# Patient Record
Sex: Female | Born: 1985
Health system: Southern US, Community
[De-identification: ages and names within clinical notes are randomized; demographics above are authoritative.]

## PROBLEM LIST (undated history)

## (undated) DIAGNOSIS — Z789 Other specified health status: Secondary | ICD-10-CM

## (undated) HISTORY — DX: Other specified health status: Z78.9

---

## 2009-11-26 ENCOUNTER — Emergency Department: Payer: Self-pay | Admitting: Unknown Physician Specialty

## 2010-03-26 ENCOUNTER — Ambulatory Visit: Payer: Self-pay | Admitting: Family Medicine

## 2012-10-02 ENCOUNTER — Ambulatory Visit: Payer: Self-pay | Admitting: Obstetrics & Gynecology

## 2013-12-31 ENCOUNTER — Ambulatory Visit: Payer: Self-pay | Admitting: Obstetrics & Gynecology

## 2014-05-16 IMAGING — US US BREAST BILAT
1 series · 14 of 25 positions shown · non-contrast
Comparison: none

REASON FOR EXAM: BIL BRST MASS UOQ
COMMENTS:

PROCEDURE:     US  - US BREAST BILATERAL  - October 02, 2012  [DATE]
RESULT:     Targeted bilateral breast ultrasound is performed in the upper
outer quadrants of both breasts. The ultrasound is negative for solid or
cystic masses. Dense parenchyma with a heterogeneous echotexture is present.

[Series 1: us breast bilat · 0.08mm/px · 14 of 52 slices shown]
[im 1/52]
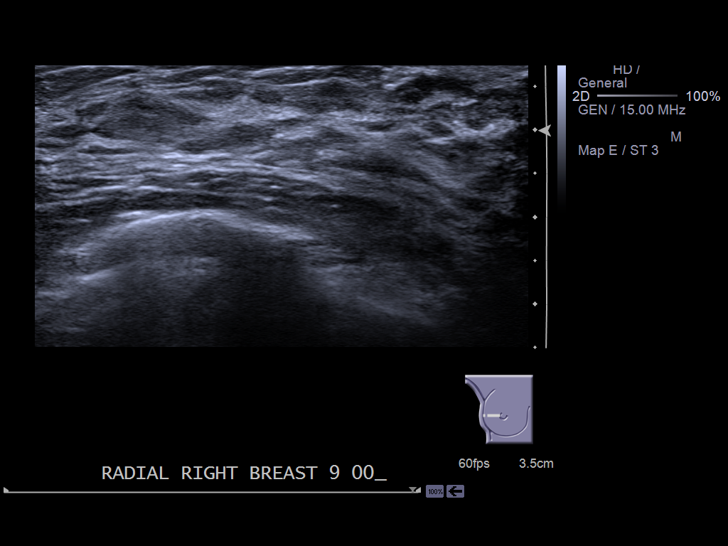
[im 5/52]
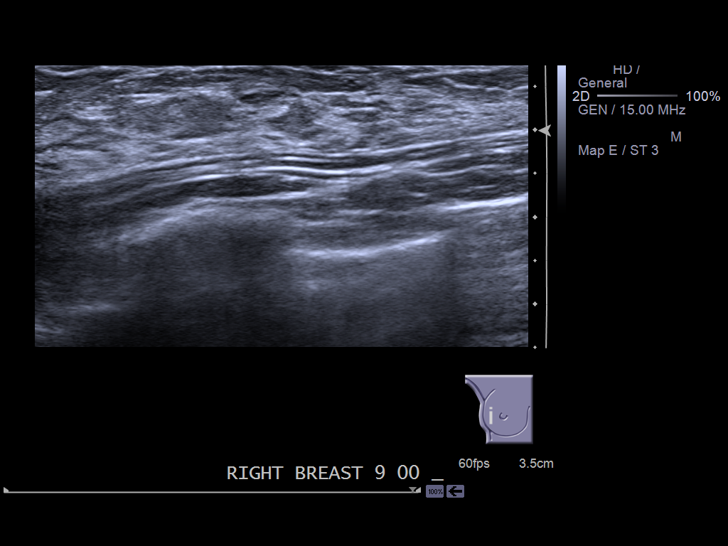
[im 9/52]
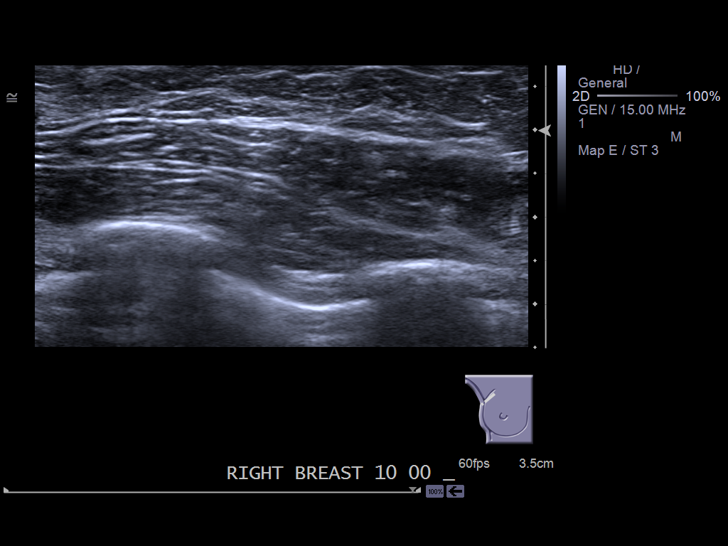
[im 13/52]
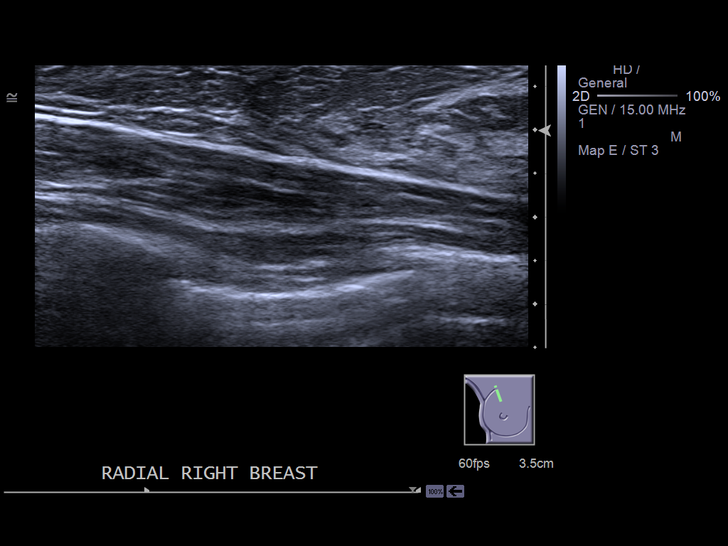
[im 18/52]
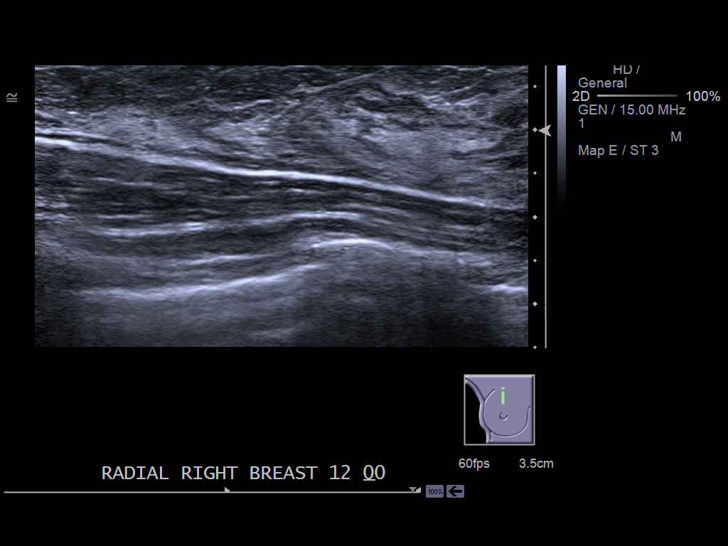
[im 20/52]
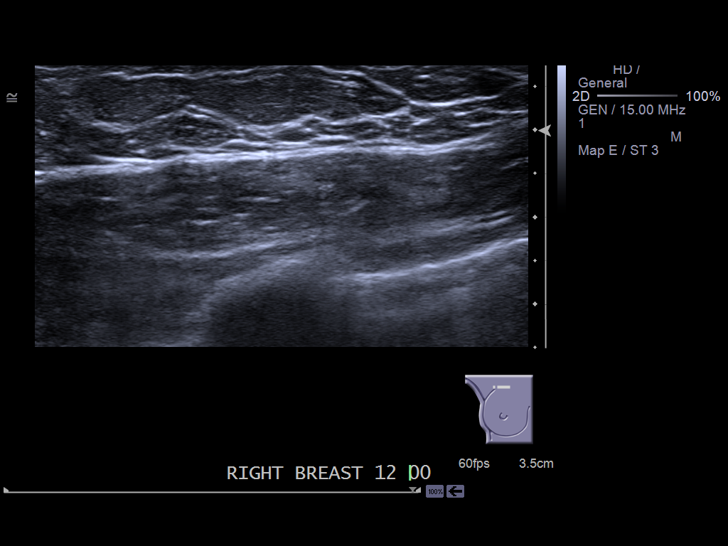
[im 24/52]
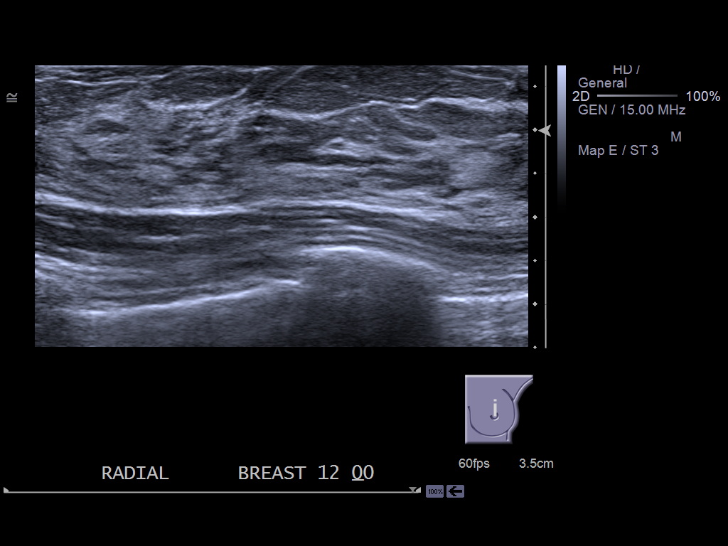
[im 28/52]
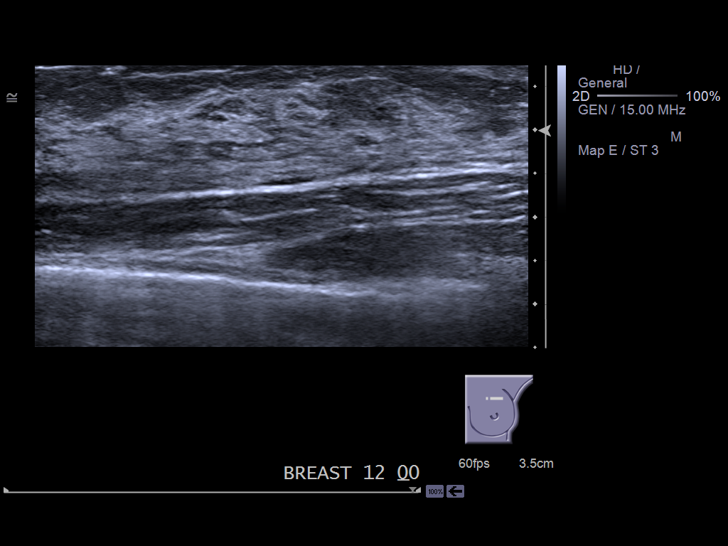
[im 32/52]
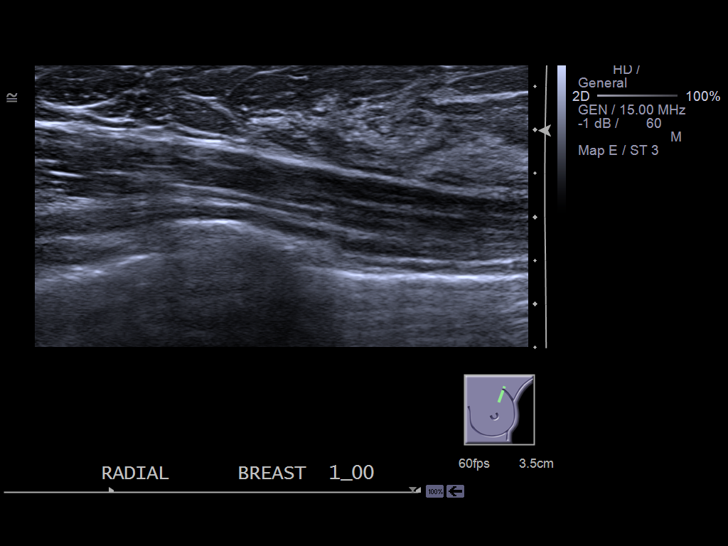
[im 35/52]
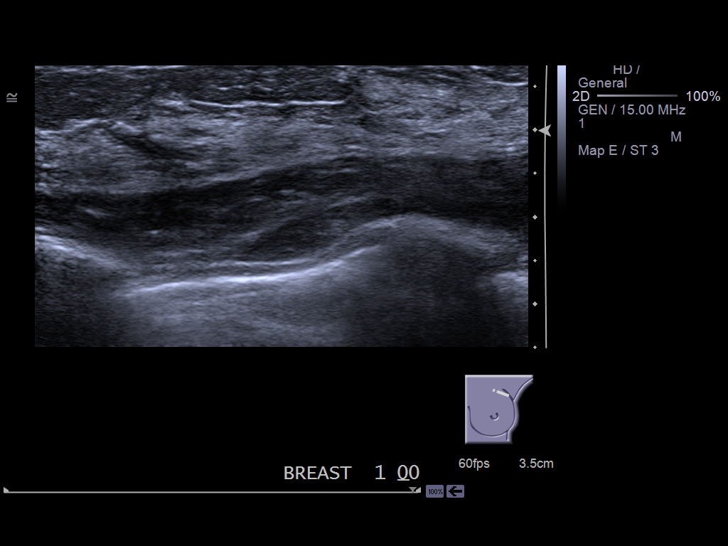
[im 39/52]
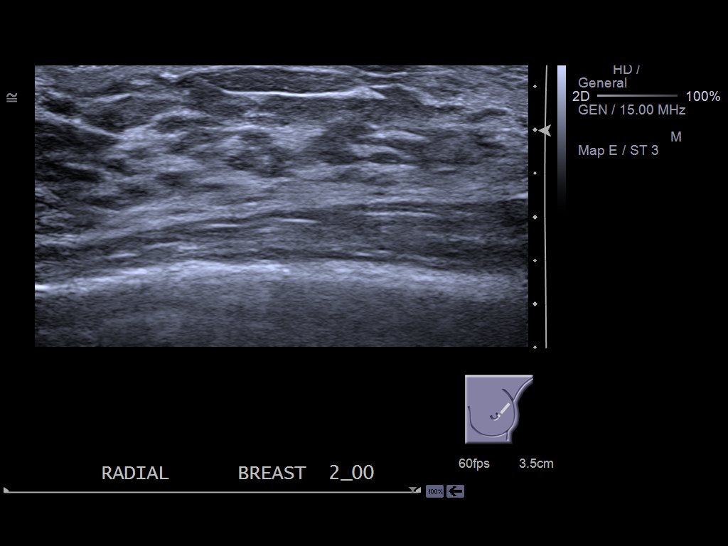
[im 43/52]
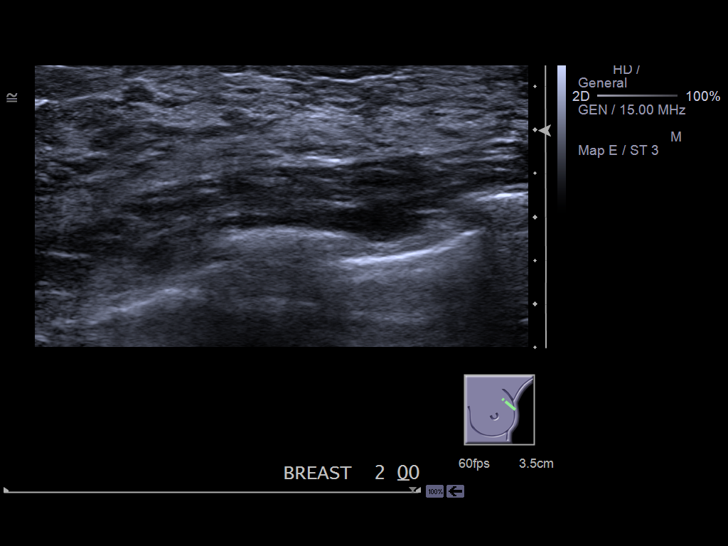
[im 47/52]
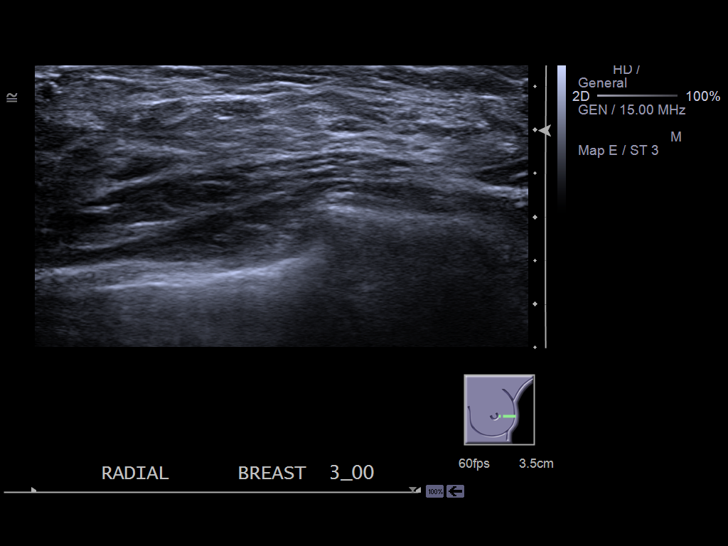
[im 52/52]
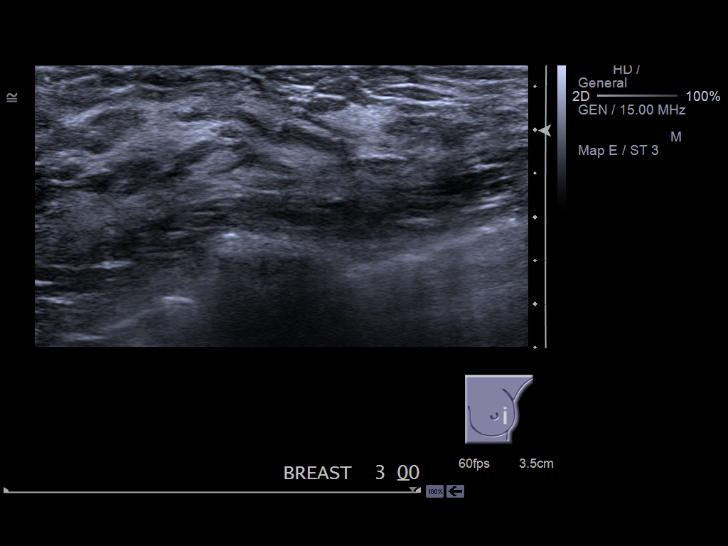

[14 of 25 positions shown; findings below may reference images not displayed]

IMPRESSION: 1. Negative targeted upper outer quadrant bilateral breast ultrasound.

[REDACTED]

## 2015-08-14 IMAGING — US US BREAST*R* LIMITED INC AXILLA
1 series · 2 of 2 positions shown · non-contrast
Comparison: Bilateral breast ultrasound dated 10/02/2012.

CLINICAL DATA: Mass felt by the patient in the upper outer right
breast. She reports that this is more focal and discrete than what
she felt last year.

EXAM:
ULTRASOUND OF THE RIGHT BREAST

[Series 1: us breast*right* limited inc axilla · 0.08mm/px · 2 of 2 slices shown]
[im 1/2]
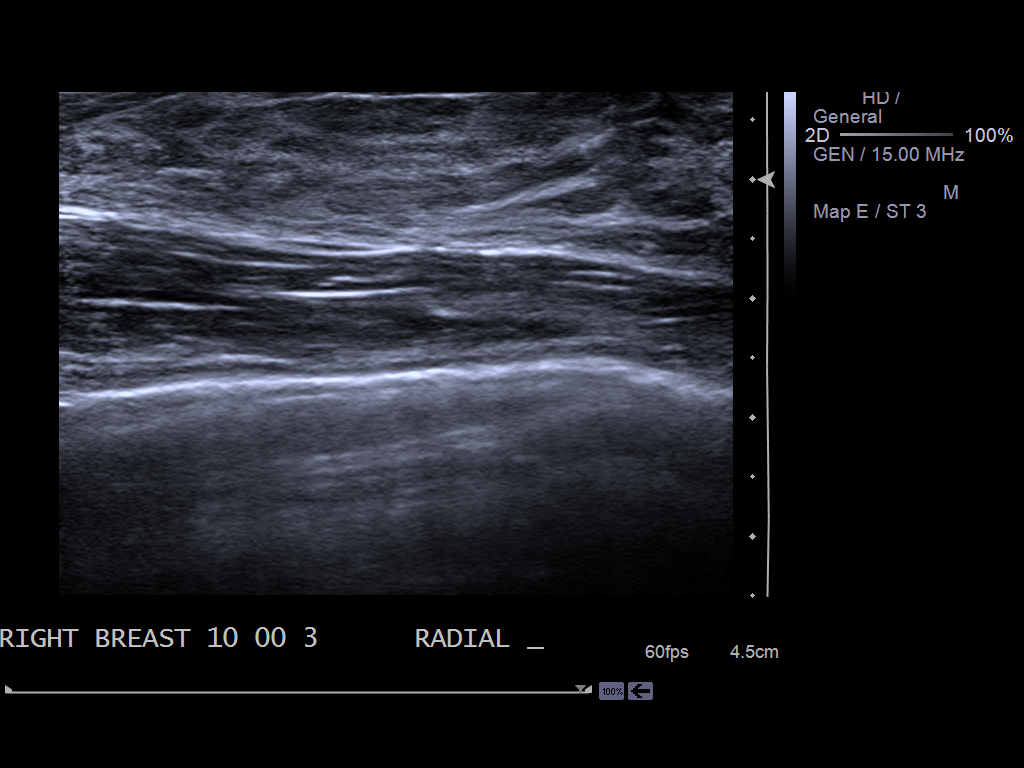
[im 2/2]
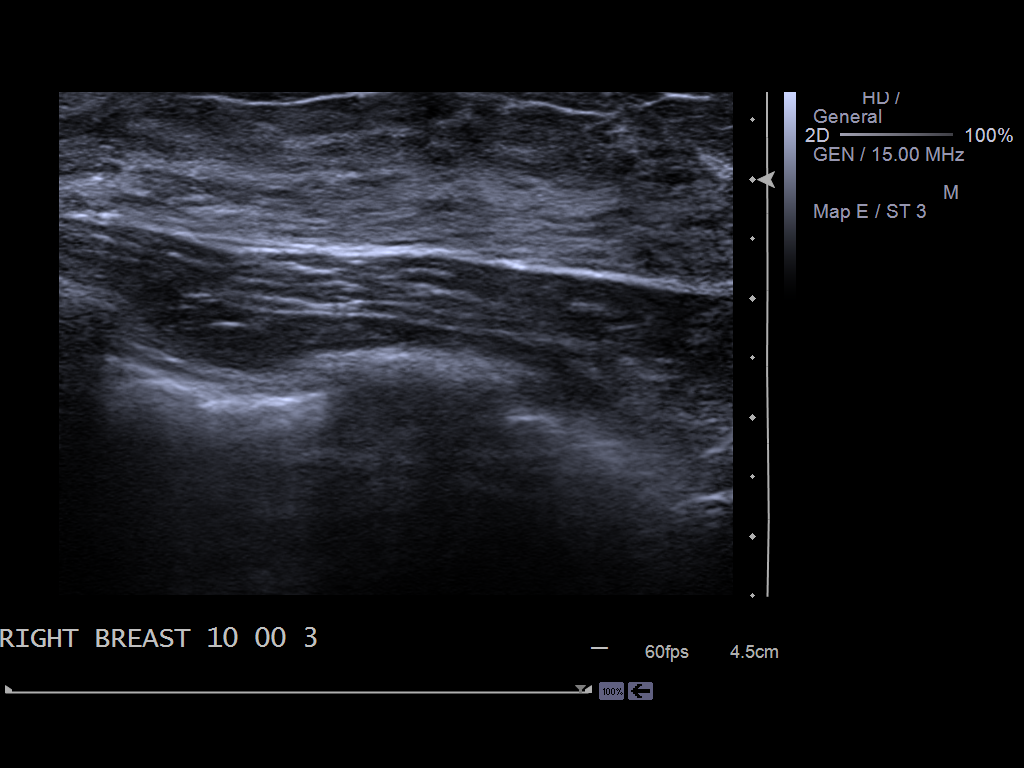

[2 of 2 positions shown; findings below may reference images not displayed]

FINDINGS: On physical exam,there is diffuse palpable soft tissue thickening in
the upper outer right breast with no discrete palpable mass.

Ultrasound is performed, showing normal appearing dense glandular
tissue in the upper outer right breast in the area of patient
concern. No discrete mass is seen.
IMPRESSION: No evidence of malignancy.

RECOMMENDATION:
Annual screening mammography beginning at age 40.

I have discussed the findings and recommendations with the patient.
Results were also provided in writing at the conclusion of the
visit. If applicable, a reminder letter will be sent to the patient
regarding the next appointment.

BI-RADS CATEGORY  1: Negative.

## 2016-06-10 ENCOUNTER — Ambulatory Visit (INDEPENDENT_AMBULATORY_CARE_PROVIDER_SITE_OTHER): Payer: 59 | Admitting: Unknown Physician Specialty

## 2016-06-10 ENCOUNTER — Encounter: Payer: Self-pay | Admitting: Unknown Physician Specialty

## 2016-06-10 VITALS — BP 112/74 | HR 68 | Temp 98.5°F | Ht 66.0 in | Wt 159.8 lb

## 2016-06-10 DIAGNOSIS — Z0000 Encounter for general adult medical examination without abnormal findings: Secondary | ICD-10-CM | POA: Diagnosis not present

## 2016-06-10 NOTE — Progress Notes (Signed)
BP 112/74 (BP Location: Left Arm, Patient Position: Sitting, Cuff Size: Normal)   Pulse 68   Temp 98.5 F (36.9 C)   Ht 5\' 6"  (1.676 m)   Wt 159 lb 12.8 oz (72.5 kg)   LMP 04/19/2016 (Exact Date)   SpO2 97%   BMI 25.79 kg/m    Subjective:    Patient ID: Laura Carey, female    DOB: 05/15/1986, 30 y.o.   MRN: [id number]  HPI: Laura Carey is a 30 y.o. female  Chief Complaint  Patient presents with  . Establish Care   Pt is here to be established as a patient.  She is anxious about her pregnancy and has had a miscarriage in the past.  She is being treated for a UTI.    She would like some blood tests as they are not done at [facility] and it has been a long time since she had one.    Depression screen PHQ 2/9 06/10/2016  Decreased Interest 1  Down, Depressed, Hopeless 0  PHQ - 2 Score 1    Relevant past medical, surgical, family and social history reviewed and updated as indicated. Interim medical history since our last visit reviewed. Allergies and medications reviewed and updated.  Review of Systems  Constitutional: Negative.   HENT: Negative.   Eyes: Negative.   Respiratory: Negative.   Cardiovascular: Negative.   Gastrointestinal: Negative.   Endocrine: Negative.   Genitourinary: Negative.   Musculoskeletal: Negative.   Skin: Negative.   Allergic/Immunologic: Negative.   Neurological: Negative.   Hematological: Negative.   Psychiatric/Behavioral: Negative.     Per HPI unless specifically indicated above     Objective:    BP 112/74 (BP Location: Left Arm, Patient Position: Sitting, Cuff Size: Normal)   Pulse 68   Temp 98.5 F (36.9 C)   Ht 5\' 6"  (1.676 m)   Wt 159 lb 12.8 oz (72.5 kg)   LMP 04/19/2016 (Exact Date)   SpO2 97%   BMI 25.79 kg/m   Wt Readings from Last 3 Encounters:  06/10/16 159 lb 12.8 oz (72.5 kg)    Physical Exam  Constitutional: She is oriented to person, place, and time. She appears well-developed and well-nourished. No  distress.  HENT:  Head: Normocephalic and atraumatic.  Eyes: Conjunctivae and lids are normal. Right eye exhibits no discharge. Left eye exhibits no discharge. No scleral icterus.  Neck: Normal range of motion. Neck supple. No JVD present. Carotid bruit is not present.  Cardiovascular: Normal rate, regular rhythm and normal heart sounds.   Pulmonary/Chest: Effort normal and breath sounds normal.  Abdominal: Normal appearance. There is no splenomegaly or hepatomegaly.  Musculoskeletal: Normal range of motion.  Neurological: She is alert and oriented to person, place, and time.  Skin: Skin is warm, dry and intact. No rash noted. No pallor.  Psychiatric: She has a normal mood and affect. Her behavior is normal. Judgment and thought content normal.    No results found for this or any previous visit.    Assessment & Plan:   Problem List Items Addressed This Visit    None    Visit Diagnoses    Health care maintenance    -  Primary   Relevant Orders   HIV antibody   Routine general medical examination at a health care facility       Relevant Orders   Comprehensive metabolic panel   CBC with Differential/Platelet   Lipid Panel w/o Chol/HDL Ratio   TSH  Follow up plan: Return in about 1 year (around 06/10/2017).

## 2016-06-11 ENCOUNTER — Emergency Department: Payer: 59

## 2016-06-11 ENCOUNTER — Emergency Department
Admission: EM | Admit: 2016-06-11 | Discharge: 2016-06-11 | Disposition: A | Discharge status: Home / Self Care | Payer: 59 | Attending: Emergency Medicine | Admitting: Emergency Medicine

## 2016-06-11 ENCOUNTER — Encounter: Payer: Self-pay | Admitting: Emergency Medicine

## 2016-06-11 DIAGNOSIS — O200 Threatened abortion: Secondary | ICD-10-CM | POA: Insufficient documentation

## 2016-06-11 DIAGNOSIS — Z3A01 Less than 8 weeks gestation of pregnancy: Secondary | ICD-10-CM | POA: Diagnosis not present

## 2016-06-11 DIAGNOSIS — O209 Hemorrhage in early pregnancy, unspecified: Secondary | ICD-10-CM | POA: Diagnosis present

## 2016-06-11 LAB — URINALYSIS COMPLETE WITH MICROSCOPIC (ARMC ONLY)
Bacteria, UA: NONE SEEN
Bilirubin Urine: NEGATIVE
Glucose, UA: NEGATIVE mg/dL
KETONES UR: NEGATIVE mg/dL
Leukocytes, UA: NEGATIVE
Nitrite: NEGATIVE
PH: 7 (ref 5.0–8.0)
PROTEIN: 30 mg/dL — AB
Specific Gravity, Urine: 1.019 (ref 1.005–1.030)

## 2016-06-11 LAB — COMPREHENSIVE METABOLIC PANEL
ALK PHOS: 50 IU/L (ref 39–117)
ALT: 10 IU/L (ref 0–32)
AST: 16 IU/L (ref 0–40)
Albumin/Globulin Ratio: 1.8 (ref 1.2–2.2)
Albumin: 4.3 g/dL (ref 3.5–5.5)
BUN/Creatinine Ratio: 14 (ref 9–23)
BUN: 10 mg/dL (ref 6–20)
Bilirubin Total: 1.5 mg/dL — ABNORMAL HIGH (ref 0.0–1.2)
CALCIUM: 9.3 mg/dL (ref 8.7–10.2)
CO2: 23 mmol/L (ref 18–29)
CREATININE: 0.7 mg/dL (ref 0.57–1.00)
Chloride: 102 mmol/L (ref 96–106)
GFR calc Af Amer: 134 mL/min/{1.73_m2} (ref >59)
GFR calc non Af Amer: 117 mL/min/{1.73_m2} (ref >59)
GLOBULIN, TOTAL: 2.4 g/dL (ref 1.5–4.5)
GLUCOSE: 77 mg/dL (ref 65–99)
Potassium: 4.3 mmol/L (ref 3.5–5.2)
SODIUM: 139 mmol/L (ref 134–144)
Total Protein: 6.7 g/dL (ref 6.0–8.5)

## 2016-06-11 LAB — CBC WITH DIFFERENTIAL/PLATELET
Basophils Absolute: 0.1 10*3/uL (ref 0.0–0.2)
Basophils Absolute: 0.1 10*3/uL (ref 0–0.1)
Basophils Relative: 1 %
Basos: 0 %
EOS (ABSOLUTE): 0.2 10*3/uL (ref 0.0–0.4)
EOS: 2 %
Eosinophils Absolute: 0 10*3/uL (ref 0–0.7)
Eosinophils Relative: 1 %
HEMATOCRIT: 39.9 % (ref 34.0–46.6)
HEMATOCRIT: 41.4 % (ref 35.0–47.0)
Hemoglobin: 13.2 g/dL (ref 11.1–15.9)
Hemoglobin: 13.8 g/dL (ref 12.0–16.0)
IMMATURE GRANS (ABS): 0 10*3/uL (ref 0.0–0.1)
IMMATURE GRANULOCYTES: 0 %
LYMPHS PCT: 14 %
LYMPHS: 20 %
Lymphocytes Absolute: 2.3 10*3/uL (ref 0.7–3.1)
Lymphs Abs: 1.4 10*3/uL (ref 1.0–3.6)
MCH: 29.1 pg (ref 26.6–33.0)
MCH: 30 pg (ref 26.0–34.0)
MCHC: 33.1 g/dL (ref 31.5–35.7)
MCHC: 33.3 g/dL (ref 32.0–36.0)
MCV: 88 fL (ref 79–97)
MCV: 90.2 fL (ref 80.0–100.0)
MONO ABS: 0.7 10*3/uL (ref 0.2–0.9)
MONOS ABS: 1.1 10*3/uL — AB (ref 0.1–0.9)
MONOS PCT: 7 %
Monocytes: 9 %
NEUTROS ABS: 7.8 10*3/uL — AB (ref 1.4–6.5)
NEUTROS PCT: 69 %
Neutrophils Absolute: 7.8 10*3/uL — ABNORMAL HIGH (ref 1.4–7.0)
Neutrophils Relative %: 77 %
PLATELETS: 223 10*3/uL (ref 150–379)
Platelets: 174 10*3/uL (ref 150–440)
RBC: 4.53 x10E6/uL (ref 3.77–5.28)
RBC: 4.59 MIL/uL (ref 3.80–5.20)
RDW: 13.7 % (ref 12.3–15.4)
RDW: 13.1 % (ref 11.5–14.5)
WBC: 11.4 10*3/uL — AB (ref 3.4–10.8)
WBC: 10 10*3/uL (ref 3.6–11.0)

## 2016-06-11 LAB — LIPID PANEL W/O CHOL/HDL RATIO
Cholesterol, Total: 115 mg/dL (ref 100–199)
HDL: 41 mg/dL (ref >39)
LDL Calculated: 57 mg/dL (ref 0–99)
TRIGLYCERIDES: 83 mg/dL (ref 0–149)
VLDL Cholesterol Cal: 17 mg/dL (ref 5–40)

## 2016-06-11 LAB — PREGNANCY, URINE: Preg Test, Ur: POSITIVE — AB

## 2016-06-11 LAB — TYPE AND SCREEN
ABO/RH(D): O POS
Antibody Screen: NEGATIVE

## 2016-06-11 LAB — TSH: TSH: 0.974 u[IU]/mL (ref 0.450–4.500)

## 2016-06-11 LAB — HIV ANTIBODY (ROUTINE TESTING W REFLEX): HIV SCREEN 4TH GENERATION: NONREACTIVE

## 2016-06-11 LAB — HCG, QUANTITATIVE, PREGNANCY: hCG, Beta Chain, Quant, S: 74020 m[IU]/mL — ABNORMAL HIGH (ref <5)

## 2016-06-11 LAB — ABO/RH: ABO/RH(D): O POS

## 2016-06-11 NOTE — ED Provider Notes (Addendum)
[facility] Emergency Department Provider Note  ____________________________________________   I have reviewed the triage vital signs and the nursing notes.   HISTORY  Chief Complaint Vaginal Bleeding    HPI Laura Carey is a 30 y.o. female who is G2 P0, who had 1 spontaneous miscarriage in her life, who is not on any follicular stimulating medications presents today complaining of vaginal bleeding since last night. Patient is known to be pregnant. Her last menstrual period was September 5. This would make her approximately 7-4/7 pregnant today. She started having vaginal bleeding last night with minimal cramping. She denies any other symptoms. She states that this is somewhat less than her normal menstrual bleed. Has had 2 pads today. She states she follows with [facility]. She states she did have an ultrasound which showed an early IUP    History reviewed. No pertinent past medical history.  There are no active problems to display for this patient.   History reviewed. No pertinent surgical history.  Prior to Admission medications   Medication Sig Start Date End Date Taking? Authorizing Provider  nitrofurantoin, macrocrystal-monohydrate, (MACROBID) 100 MG capsule  05/31/16   Historical Provider, MD  Prenat-FeFum-FePo-FA-Omega 3 (CONCEPT DHA PO) Take 1 tablet by mouth daily.    Historical Provider, MD    Allergies Review of patient's allergies indicates no known allergies.  Family History  Problem Relation Age of Onset  . Cancer Maternal Grandmother     breast    Social History Social History  Substance Use Topics  . Smoking status: Never Smoker  . Smokeless tobacco: Never Used  . Alcohol use No    Review of Systems Constitutional: No fever/chills Eyes: No visual changes. ENT: No sore throat. No stiff neck no neck pain Cardiovascular: Denies chest pain. Respiratory: Denies shortness of breath. Gastrointestinal:   no vomiting.  No  diarrhea.  No constipation. Genitourinary: Negative for dysuria. Musculoskeletal: Negative lower extremity swelling Skin: Negative for rash. Neurological: Negative for severe headaches, focal weakness or numbness. 10-point ROS otherwise negative.  ____________________________________________   PHYSICAL EXAM:  VITAL SIGNS: ED Triage Vitals  Enc Vitals Group     BP 06/11/16 0708 106/65     Pulse Rate 06/11/16 0708 67     Resp 06/11/16 0708 18     Temp 06/11/16 0708 98.8 F (37.1 C)     Temp src --      SpO2 06/11/16 0708 98 %     Weight 06/11/16 0707 159 lb (72.1 kg)     Height --      Head Circumference --      Peak Flow --      Pain Score 06/11/16 0707 2     Pain Loc --      Pain Edu? --      Excl. in GC? --     Constitutional: Alert and oriented. Well appearing and in no acute distress. Eyes: Conjunctivae are normal. PERRL. EOMI. Head: Atraumatic. Nose: No congestion/rhinnorhea. Mouth/Throat: Mucous membranes are moist.  Oropharynx non-erythematous. Neck: No stridor.   Nontender with no meningismus Cardiovascular: Normal rate, regular rhythm. Grossly normal heart sounds.  Good peripheral circulation. Respiratory: Normal respiratory effort.  No retractions. Lungs CTAB. Abdominal: Soft and nontender. No distention. No guarding no rebound Back:  [place] is no focal tenderness or step off.  there is no midline tenderness there are no lesions noted. there is no CVA ten Pelvic exam: Female nurse chaperone present, no external lesions noted, physiologic vaginal discharge noted  with no purulent discharge, no cervical motion tenderness, no adnexal tenderness or mass, there is no significant uterine tenderness or mass. Mild vaginal bleeding Musculoskeletal: No lower extremity tenderness, no upper extremity tenderness. No joint effusions, no DVT signs strong distal pulses no edema Neurologic:  Normal speech and language. No gross focal neurologic deficits are appreciated.   Skin:  Skin is warm, dry and intact. No rash noted. Psychiatric: Mood and affect are normal. Speech and behavior are normal.  ____________________________________________   LABS (all labs ordered are listed, but only abnormal results are displayed)  Labs Reviewed  PREGNANCY, URINE - Abnormal; Notable for the following:       Result Value   Preg Test, Ur POSITIVE (*)    All other components within normal limits  URINALYSIS COMPLETEWITH MICROSCOPIC ([facility] ONLY) - Abnormal; Notable for the following:    Color, Urine AMBER (*)    APPearance CLEAR (*)    Hgb urine dipstick 3+ (*)    Protein, ur 30 (*)    Squamous Epithelial / LPF 0-5 (*)    All other components within normal limits  CBC WITH DIFFERENTIAL/PLATELET  CBC WITH DIFFERENTIAL/PLATELET  HCG, QUANTITATIVE, PREGNANCY  ABO/RH   ____________________________________________  EKG  I personally interpreted any EKGs ordered by me or triage  ____________________________________________  RADIOLOGY  I reviewed any imaging ordered by me or triage that were performed during my shift and, if possible, patient and/or family made aware of any abnormal findings. ____________________________________________   PROCEDURES  Procedure(s) performed: None  Procedures  Critical Care performed: None  ____________________________________________   INITIAL IMPRESSION / ASSESSMENT AND PLAN / ED COURSE  Pertinent labs & imaging results that were available during my care of the patient were reviewed by me and considered in my medical decision making (see chart for details).  Patient with what is reported to be IUP with vaginal bleeding, she is Rh+, her Sharene ButtersQuant is reported by the lab as 1363, she is hemodynamically stable. Because I cannot obtain outside ultrasound records, I'll obtain an ultrasound here which we are waiting for the results.  ----------------------------------------- 11:27 AM on  06/11/2016 -----------------------------------------  His chest with Dr. Jean RosenthalJackson who agrees with management thus far. Patient labs reported by the labs as hemoglobin of 13.7 platelets 170 white count 10.3 she remains hemodynamically stable with minimal discomfort. I am concerned about the free fluid in her pelvis. Some be a cyst rupture or possibly heterotropic ectopic but this is thought to be much less likely. Dr. Jean RosenthalJackson will evaluate the patient.  ----------------------------------------- 1:52 PM on 06/11/2016 -----------------------------------------  Seen and evaluated by Dr. Jean RosenthalJackson, he feels the patient is safe for discharge which we will do, return percussion some follow-up given and understood. Abdomen benign no evidence of hemoperitoneum clinically on exam or any tenderness.  Clinical Course   ____________________________________________   FINAL CLINICAL IMPRESSION(S) / ED DIAGNOSES  Final diagnoses:  None      This chart was dictated using voice recognition software.  Despite best efforts to proofread,  errors can occur which can change meaning.      Jeanmarie PlantJames A Laree Garron, MD 06/11/16 1042    Jeanmarie PlantJames A Melinda Gwinner, MD 06/11/16 1128    Jeanmarie PlantJames A Lashonne Shull, MD 06/11/16 [phone] [id number]

## 2016-06-11 NOTE — ED Notes (Signed)
Patient states that she is about [redacted] weeks pregnant. Patient seen by her OB at [facility] on Wednesday and had an ultrasound, per patient report everything looked good and they were able to see fetal heartbeat.  Patient states that last night around 9 pm she started having heavy bright red bleeding with clots, patient states that she has had some mild back pain.   Patient denies urinary symptoms, fevers, chills, or recent intercourse.

## 2016-06-11 NOTE — ED Notes (Signed)
Patient transported to ultrasound.

## 2016-06-11 NOTE — Consult Note (Signed)
OBSTETRICS & GYNECOLOGY CONSULT NOTE  GYN Consultation  Attending Provider: Dr.McShane  Laura Carey [id number] 06/11/2016 3:06 PM    Reason for Consultation:   Laura Carey is a 30 y.o. G2P0 female at approximately [redacted] weeks gestation with vaginal bleeding.  History of Present Ilness:   Patient had onset of vaginal bleeding last night that was described as like a period (heavy days) for her.  She continues to have some bleeding today.  She denies abdominal pain.  She is a patient with a known intrauterine pregnancy and bicornuate uterus. She has a history of a 10-week loss with G1.  She had a pelvic ultrasound 3 days ago without any issues identified per her report (no report available at this time).  Nothing makes the bleeding better or worse.  No associated pain.  Past Medical History: None  Past Surgical History: None  Allergies: No Known Allergies    Prior to Admission medications   Medication Sig Start Date End Date Taking? Authorizing Provider  nitrofurantoin, macrocrystal-monohydrate, (MACROBID) 100 MG capsule  05/31/16   Historical Provider, MD  Prenat-FeFum-FePo-FA-Omega 3 (CONCEPT DHA PO) Take 1 tablet by mouth daily.    Historical Provider, MD   Obstetric History: She is a 582P0 female s/p SAB with G1 at about 10 weeks.  Social History:  She  reports that she has never smoked. She has never used smokeless tobacco. She reports that she does not drink alcohol or use drugs.  Family History:  family history includes Cancer in her maternal grandmother.   Review of Systems:  Negative x 10 systems reviewed except as noted in the HPI.    Objective    BP 104/66   Pulse 62   Temp 98.7 F (37.1 C) (Oral)   Resp 18   Wt 159 lb (72.1 kg)   LMP 04/19/2016 (Exact Date)   SpO2 99%   BMI 25.66 kg/m  Physical Exam  General:  She is a well appearing female in no distress.  HEENT:  Normocephalic, atraumatic.   Neck:  supple, no lymphadenopathy Cardiac:  RRR Pulmonary:   Clear to auscultation bilaterally. No wheezes, rales, rhonchi.   Abdomen:  Soft, non-tender, non-distended, +BS, no rebound or guarding  Pelvic:  Deferred. Extremities:  Non-tender, symmetric no edema bilaterally.   Neurologic:  Alert & oriented x 3.  Appropriate, conversant.    Laboratory Results:   Lab Results  Component Value Date   WBC 10.0 06/11/2016   RBC 4.59 06/11/2016   HGB 13.8 06/11/2016   HCT 41.4 06/11/2016   PLT 174 06/11/2016   NA 139 06/10/2016   K 4.3 06/10/2016   CREATININE 0.70 06/10/2016   Lab Results  Component Value Date   PREGTESTUR POSITIVE (A) 06/11/2016    Imaging Results:  [place] Ob Comp Less 14 Wks  Result Date: 06/11/2016 CLINICAL DATA:  Vaginal bleeding EXAM: OBSTETRIC <14 WK US AND TRANSVAGINAL OB US TECHNIQUE: Both transabdominal and transvaginal ultrasound examinations were performed for complete evaluation of the gestation as well as the maternal uterus, adnexal regions, and pelvic cul-de-sac. Transvaginal technique was performed to assess early pregnancy. COMPARISON:  None. FINDINGS: Intrauterine gestational sac: Single Yolk sac:  Visualized. Embryo:  Visualized. Cardiac Activity: Visualized. Heart Rate: 139  bpm CRL:  11.4  mm   7 w   2 d                  US EDC: 01/26/2017 Subchorionic hemorrhage: Large heterogeneous area without internal Doppler flow, adjacent to  the gestational sac most consistent with a large subchorionic hemorrhage measuring 3.6 x 2.7 x 3.1 cm. Maternal uterus/adnexae: There is a bicornuate uterus. Right ovarian corpus luteum cyst. Normal left ovary. Moderate amount of hypoechoic pelvic free fluid. IMPRESSION: 1. Single live intrauterine pregnancy as described above. 2. Large heterogeneous area adjacent to the gestational sac most consistent with a large subchorionic hemorrhage measuring 3.6 x 2.7 x 3.1 cm. 3. Bicornuate uterus with the pregnancy in the right horn. 4. Moderate amount of complex pelvic free fluid likely hemorrhagic.  Electronically Signed   By: Elige KoHetal  Patel   On: 06/11/2016 11:06   [place] Ob Transvaginal  Result Date: 06/11/2016 CLINICAL DATA:  Vaginal bleeding EXAM: OBSTETRIC <14 WK US AND TRANSVAGINAL OB US TECHNIQUE: Both transabdominal and transvaginal ultrasound examinations were performed for complete evaluation of the gestation as well as the maternal uterus, adnexal regions, and pelvic cul-de-sac. Transvaginal technique was performed to assess early pregnancy. COMPARISON:  None. FINDINGS: Intrauterine gestational sac: Single Yolk sac:  Visualized. Embryo:  Visualized. Cardiac Activity: Visualized. Heart Rate: 139  bpm CRL:  11.4  mm   7 w   2 d                  US EDC: 01/26/2017 Subchorionic hemorrhage: Large heterogeneous area without internal Doppler flow, adjacent to the gestational sac most consistent with a large subchorionic hemorrhage measuring 3.6 x 2.7 x 3.1 cm. Maternal uterus/adnexae: There is a bicornuate uterus. Right ovarian corpus luteum cyst. Normal left ovary. Moderate amount of hypoechoic pelvic free fluid. IMPRESSION: 1. Single live intrauterine pregnancy as described above. 2. Large heterogeneous area adjacent to the gestational sac most consistent with a large subchorionic hemorrhage measuring 3.6 x 2.7 x 3.1 cm. 3. Bicornuate uterus with the pregnancy in the right horn. 4. Moderate amount of complex pelvic free fluid likely hemorrhagic. Electronically Signed   By: Elige KoHetal  Patel   On: 06/11/2016 11:06    Assessment & Recommendations   Laura Carey is a 30 y.o. G2P0 at about [redacted] weeks gestation with a threatened abortion. She also has free fluid in her pelvis that is complex.  Differential includes, ruptured hemorrhagic ovarian cyst, heterotopic pregnancy.   Recommendations:  1. Discussed very low risk of heterotopic pregnancy. Given that she currently has a viable IUP currently, there is not much to do at this time. Discussed strong possibility of SAB given large [facility] in her uterus.  2. We  did discuss the free fluid in her pelvis and the possible etiologies.  She will continue to monitor.  We discussed that if she develops severe abdominal pain and increased bleeding, that she should return to the ER for evaluation. Otherwise, discussed normal amount of bleeding with miscarriage.  She has had one before. Discussed that if she is soaking > 2 pads per hour for > 2 hours, then she should return to the ER for further evaluation. 3. Recommend follow up in the clinic with ultrasound this week to assess fetal well being and status of SAB.   Conard NovakJackson, Zarif Rathje D, MD 06/11/2016 3:06 PM

## 2016-06-11 NOTE — ED Triage Notes (Signed)
Pt to ed with c/o heavy vaginal bleeding that started last night.  Pt denies abd pain, however reports mild back pain.  Reports she is approx [redacted] weeks pregnant. G2 SAB 1

## 2016-06-13 ENCOUNTER — Encounter: Payer: Self-pay | Admitting: Unknown Physician Specialty

## 2016-09-28 ENCOUNTER — Ambulatory Visit (INDEPENDENT_AMBULATORY_CARE_PROVIDER_SITE_OTHER): Payer: 59 | Admitting: Unknown Physician Specialty

## 2016-09-28 ENCOUNTER — Encounter: Payer: Self-pay | Admitting: Unknown Physician Specialty

## 2016-09-28 VITALS — BP 119/74 | HR 54 | Temp 98.9°F | Wt 162.0 lb

## 2016-09-28 DIAGNOSIS — B09 Unspecified viral infection characterized by skin and mucous membrane lesions: Secondary | ICD-10-CM

## 2016-09-28 DIAGNOSIS — R1310 Dysphagia, unspecified: Secondary | ICD-10-CM | POA: Insufficient documentation

## 2016-09-28 DIAGNOSIS — R1319 Other dysphagia: Secondary | ICD-10-CM | POA: Insufficient documentation

## 2016-09-28 NOTE — Assessment & Plan Note (Signed)
Pt reports rash has been improving and will continue to monitor it and apply OTC topical steroid cream if itching returns.

## 2016-09-28 NOTE — Assessment & Plan Note (Addendum)
Benign exam.  Refer to ENT. 

## 2016-09-28 NOTE — Progress Notes (Signed)
BP 119/74 (BP Location: Left Arm, Patient Position: Sitting, Cuff Size: Normal)   Pulse (!) 54   Temp 98.9 F (37.2 C)   Wt 162 lb (73.5 kg)   LMP 09/01/2016 (Exact Date)   SpO2 100%   Breastfeeding? Unknown   BMI 26.15 kg/m    Subjective:    Patient ID: Laura Carey, female    DOB: 07/15/1986, 31 y.o.   MRN: [id number]  HPI: Laura Carey is a 31 y.o. female  Chief Complaint  Patient presents with  . Rash    pt states she has a rash that came up about 2 weeks ago, states it was itching at first but not now   . Throat problem    pt states that her throat is not sore but feels like something is stuck in there    Pt complaining of bilateral lower extremity rash x 2 weeks that was itching but has since been improving and no longer itches currently. Pt denies fever, use of new topical ointments/shampoos/detergents/medications, recent travel, or new exposure to the outdoors. Pt has not tried any Benadryl or topical ointments for relief and has no known drug or food allergies. Pt denies sick contacts and states husband does not have similar rash.   Pt complaining of constant difficulty swallowing x 2 weeks. She states that it "feels like something is stuck in my throat". Pt denies neck swelling, sore throat, chest pain, SOB, abdominal pain, nausea, vomiting. Pt states her diet has remained the same and she is able to tolerate solids and liquids without difficulty. Pt denies history of gastric reflux or recent illness. Pt has not taken anything OTC for relief.   Relevant past medical, surgical, family and social history reviewed and updated as indicated. Interim medical history since our last visit reviewed. Allergies and medications reviewed and updated.  Review of Systems  Per HPI unless specifically indicated above     Objective:    BP 119/74 (BP Location: Left Arm, Patient Position: Sitting, Cuff Size: Normal)   Pulse (!) 54   Temp 98.9 F (37.2 C)   Wt 162 lb (73.5 kg)    LMP 09/01/2016 (Exact Date)   SpO2 100%   Breastfeeding? Unknown   BMI 26.15 kg/m   Wt Readings from Last 3 Encounters:  09/28/16 162 lb (73.5 kg)  06/11/16 159 lb (72.1 kg)  06/10/16 159 lb 12.8 oz (72.5 kg)    Physical Exam  Constitutional: She is oriented to person, place, and time. She appears well-developed and well-nourished.  HENT:  Head: Normocephalic and atraumatic.  Right Ear: External ear normal.  Left Ear: External ear normal.  Nose: Nose normal.  Mouth/Throat: Oropharynx is clear and moist.  Eyes: Conjunctivae and EOM are normal. Pupils are equal, round, and reactive to light. Right eye exhibits no discharge. Left eye exhibits no discharge. No scleral icterus.  Neck: Normal range of motion. Neck supple. No JVD present. No tracheal deviation present. No thyromegaly present.  Cardiovascular: Normal rate, regular rhythm, normal heart sounds and intact distal pulses.  Exam reveals no gallop and no friction rub.   No murmur heard. Pulmonary/Chest: Effort normal and breath sounds normal. No stridor. No respiratory distress. She has no wheezes. She has no rales. She exhibits no tenderness.  Abdominal: Soft. Bowel sounds are normal. She exhibits no distension and no mass. There is no tenderness. There is no rebound and no guarding.  Musculoskeletal: Normal range of motion. She exhibits no edema, tenderness or deformity.  Lymphadenopathy:    She has no cervical adenopathy.  Neurological: She is alert and oriented to person, place, and time. She has normal reflexes. No cranial nerve deficit. Coordination normal.  Skin: Skin is warm and dry. Rash noted.  Bilateral calves display concentrated maculopapular erythematous rash with white central umbilical clearing. Nontender and no drainage noted. Isolated erythematous macules on dorsum of left hip.   Psychiatric: She has a normal mood and affect. Her behavior is normal. Judgment and thought content normal.  Nursing note and vitals  reviewed.     Assessment & Plan:   Problem List Items Addressed This Visit      Unprioritized   Esophageal dysphagia - Primary    Benign exam.  Refer to ENT      Relevant Orders   Ambulatory referral to ENT   Viral exanthem    Pt reports rash has been improving and will continue to monitor it and apply OTC topical steroid cream if itching returns.           Follow up plan: Return if symptoms worsen or fail to improve.

## 2016-10-03 ENCOUNTER — Encounter: Payer: Self-pay | Admitting: Unknown Physician Specialty

## 2016-10-07 ENCOUNTER — Encounter: Payer: Self-pay | Admitting: Unknown Physician Specialty

## 2016-10-10 MED ORDER — TRIAMCINOLONE ACETONIDE 0.5 % EX CREA: 1.0000 "application " | TOPICAL_CREAM | Freq: Three times a day (TID) | CUTANEOUS | 0 refills | Status: DC

## 2016-12-07 DIAGNOSIS — Q5128 Other doubling of uterus, other specified: Secondary | ICD-10-CM | POA: Insufficient documentation

## 2017-01-13 HISTORY — PX: HYSTEROSCOPY: SHX211

## 2017-03-15 ENCOUNTER — Encounter: Payer: Self-pay | Admitting: Unknown Physician Specialty

## 2017-06-12 ENCOUNTER — Encounter: Payer: 59 | Admitting: Unknown Physician Specialty

## 2017-06-21 ENCOUNTER — Ambulatory Visit (INDEPENDENT_AMBULATORY_CARE_PROVIDER_SITE_OTHER): Payer: 59 | Admitting: Unknown Physician Specialty

## 2017-06-21 ENCOUNTER — Encounter: Payer: Self-pay | Admitting: Unknown Physician Specialty

## 2017-06-21 VITALS — BP 106/74 | HR 64 | Temp 98.8°F | Ht 66.1 in | Wt 152.5 lb

## 2017-06-21 DIAGNOSIS — S43421A Sprain of right rotator cuff capsule, initial encounter: Secondary | ICD-10-CM | POA: Diagnosis not present

## 2017-06-21 DIAGNOSIS — L509 Urticaria, unspecified: Secondary | ICD-10-CM | POA: Insufficient documentation

## 2017-06-21 DIAGNOSIS — Z0000 Encounter for general adult medical examination without abnormal findings: Secondary | ICD-10-CM

## 2017-06-21 NOTE — Progress Notes (Signed)
BP 106/74   Pulse 64   Temp 98.8 F (37.1 C) (Oral)   Ht 5' 6.1" (1.679 m)   Wt 152 lb 8 oz (69.2 kg)   LMP 06/15/2017 (Exact Date)   SpO2 100%   BMI 24.54 kg/m    Subjective:    Patient ID: Laura Carey, female    DOB: 03/21/1986, 31 y.o.   MRN: [id number]  HPI: Laura Carey is a 31 y.o. female  Chief Complaint  Patient presents with  . Annual Exam   Rash Pt states he has a rash that comes and goes.  Husband as the same rash.  States the rash comes up somewhere in the body.  Describes it as dots and it itches.    Relevant past medical, surgical, family and social history reviewed and updated as indicated. Interim medical history since our last visit reviewed. Allergies and medications reviewed and updated.  Review of Systems  Constitutional: Negative.   HENT: Negative.   Eyes: Negative.   Respiratory: Negative.   Cardiovascular: Negative.   Gastrointestinal: Negative.   Endocrine: Negative.   Genitourinary: Negative.   Skin: Negative.   Allergic/Immunologic: Negative.   Neurological: Negative.   Hematological: Negative.   Psychiatric/Behavioral: Negative.     Per HPI unless specifically indicated above     Objective:    BP 106/74   Pulse 64   Temp 98.8 F (37.1 C) (Oral)   Ht 5' 6.1" (1.679 m)   Wt 152 lb 8 oz (69.2 kg)   LMP 06/15/2017 (Exact Date)   SpO2 100%   BMI 24.54 kg/m   Wt Readings from Last 3 Encounters:  06/21/17 152 lb 8 oz (69.2 kg)  09/28/16 162 lb (73.5 kg)  06/11/16 159 lb (72.1 kg)    Physical Exam  Constitutional: She is oriented to person, place, and time. She appears well-developed and well-nourished.  HENT:  Head: Normocephalic and atraumatic.  Eyes: Pupils are equal, round, and reactive to light. Right eye exhibits no discharge. Left eye exhibits no discharge. No scleral icterus.  Neck: Normal range of motion. Neck supple. Carotid bruit is not present. No thyromegaly present.  Cardiovascular: Normal rate, regular  rhythm and normal heart sounds. Exam reveals no gallop and no friction rub.  No murmur heard. Pulmonary/Chest: Effort normal and breath sounds normal. No respiratory distress. She has no wheezes. She has no rales.  Abdominal: Soft. Bowel sounds are normal. There is no tenderness. There is no rebound.  Genitourinary: No breast swelling, tenderness or discharge.  Musculoskeletal: Normal range of motion.  Lymphadenopathy:    She has no cervical adenopathy.  Neurological: She is alert and oriented to person, place, and time.  Skin: Skin is warm, dry and intact. No rash noted.  Psychiatric: She has a normal mood and affect. Her speech is normal and behavior is normal. Judgment and thought content normal. Cognition and memory are normal.    Results for orders placed or performed during the hospital encounter of 06/11/16  CBC with Differential/Platelet  Result Value Ref Range   WBC 10.0 3.6 - 11.0 K/uL   RBC 4.59 3.80 - 5.20 MIL/uL   Hemoglobin 13.8 12.0 - 16.0 g/dL   HCT 40.941.4 81.135.0 - 91.447.0 %   MCV 90.2 80.0 - 100.0 fL   MCH 30.0 26.0 - 34.0 pg   MCHC 33.3 32.0 - 36.0 g/dL   RDW 78.213.1 95.611.5 - 21.314.5 %   Platelets 174 150 - 440 K/uL   Neutrophils Relative % 77 %  Neutro Abs 7.8 (H) 1.4 - 6.5 K/uL   Lymphocytes Relative 14 %   Lymphs Abs 1.4 1.0 - 3.6 K/uL   Monocytes Relative 7 %   Monocytes Absolute 0.7 0.2 - 0.9 K/uL   Eosinophils Relative 1 %   Eosinophils Absolute 0.0 0 - 0.7 K/uL   Basophils Relative 1 %   Basophils Absolute 0.1 0 - 0.1 K/uL  Pregnancy, urine  Result Value Ref Range   Preg Test, Ur POSITIVE (A) NEGATIVE  hCG, quantitative, pregnancy  Result Value Ref Range   hCG, Beta Chain, Quant, S 74,020 (H) <5 mIU/mL  Urinalysis complete, with microscopic ([facility] only)  Result Value Ref Range   Color, Urine AMBER (A) YELLOW   APPearance CLEAR (A) CLEAR   Glucose, UA NEGATIVE NEGATIVE mg/dL   Bilirubin Urine NEGATIVE NEGATIVE   Ketones, ur NEGATIVE NEGATIVE mg/dL   Specific  Gravity, Urine 1.019 1.005 - 1.030   Hgb urine dipstick 3+ (A) NEGATIVE   pH 7.0 5.0 - 8.0   Protein, ur 30 (A) NEGATIVE mg/dL   Nitrite NEGATIVE NEGATIVE   Leukocytes, UA NEGATIVE NEGATIVE   RBC / HPF TOO NUMEROUS TO COUNT 0 - 5 RBC/hpf   WBC, UA 0-5 0 - 5 WBC/hpf   Bacteria, UA NONE SEEN NONE SEEN   Squamous Epithelial / LPF 0-5 (A) NONE SEEN   Mucus PRESENT   ABO/Rh  Result Value Ref Range   ABO/RH(D) O POS   Type and screen [facility]  Result Value Ref Range   ABO/RH(D) O POS    Antibody Screen NEG    Sample Expiration 06/14/2016   mmmmmmmmmmmmmmmmm  mm    Assessment & Plan:   Problem List Items Addressed This Visit      Unprioritized   Urticaria    Continue with Triamcinolone       Other Visit Diagnoses    Annual physical exam    -  Primary   Relevant Orders   CBC with Differential/Platelet   Comprehensive metabolic panel   Lipid Panel w/o Chol/HDL Ratio   TSH   Sprain of right rotator cuff capsule, initial encounter       Exercises given       Follow up plan: Return in about 1 year (around 06/21/2018).

## 2017-06-21 NOTE — Assessment & Plan Note (Signed)
Continue with Triamcinolone

## 2017-06-22 LAB — COMPREHENSIVE METABOLIC PANEL
A/G RATIO: 1.9 (ref 1.2–2.2)
ALBUMIN: 4.7 g/dL (ref 3.5–5.5)
ALT: 11 IU/L (ref 0–32)
AST: 13 IU/L (ref 0–40)
Alkaline Phosphatase: 53 IU/L (ref 39–117)
BILIRUBIN TOTAL: 0.8 mg/dL (ref 0.0–1.2)
BUN / CREAT RATIO: 15 (ref 9–23)
BUN: 13 mg/dL (ref 6–20)
CHLORIDE: 103 mmol/L (ref 96–106)
CO2: 28 mmol/L (ref 20–29)
Calcium: 9.5 mg/dL (ref 8.7–10.2)
Creatinine, Ser: 0.84 mg/dL (ref 0.57–1.00)
GFR calc non Af Amer: 93 mL/min/{1.73_m2} (ref >59)
GFR, EST AFRICAN AMERICAN: 107 mL/min/{1.73_m2} (ref >59)
Globulin, Total: 2.5 g/dL (ref 1.5–4.5)
Glucose: 92 mg/dL (ref 65–99)
POTASSIUM: 4.9 mmol/L (ref 3.5–5.2)
Sodium: 143 mmol/L (ref 134–144)
TOTAL PROTEIN: 7.2 g/dL (ref 6.0–8.5)

## 2017-06-22 LAB — CBC WITH DIFFERENTIAL/PLATELET
BASOS: 1 %
Basophils Absolute: 0.1 10*3/uL (ref 0.0–0.2)
EOS (ABSOLUTE): 0.2 10*3/uL (ref 0.0–0.4)
Eos: 2 %
Hematocrit: 39.3 % (ref 34.0–46.6)
Hemoglobin: 12.8 g/dL (ref 11.1–15.9)
Immature Grans (Abs): 0 10*3/uL (ref 0.0–0.1)
Immature Granulocytes: 0 %
LYMPHS ABS: 2.4 10*3/uL (ref 0.7–3.1)
LYMPHS: 34 %
MCH: 29.3 pg (ref 26.6–33.0)
MCHC: 32.6 g/dL (ref 31.5–35.7)
MCV: 90 fL (ref 79–97)
Monocytes Absolute: 0.4 10*3/uL (ref 0.1–0.9)
Monocytes: 5 %
NEUTROS ABS: 4.3 10*3/uL (ref 1.4–7.0)
Neutrophils: 58 %
PLATELETS: 261 10*3/uL (ref 150–379)
RBC: 4.37 x10E6/uL (ref 3.77–5.28)
RDW: 12.9 % (ref 12.3–15.4)
WBC: 7.3 10*3/uL (ref 3.4–10.8)

## 2017-06-22 LAB — LIPID PANEL W/O CHOL/HDL RATIO
Cholesterol, Total: 136 mg/dL (ref 100–199)
HDL: 51 mg/dL (ref >39)
LDL Calculated: 63 mg/dL (ref 0–99)
Triglycerides: 108 mg/dL (ref 0–149)
VLDL Cholesterol Cal: 22 mg/dL (ref 5–40)

## 2017-06-22 LAB — TSH: TSH: 1.41 u[IU]/mL (ref 0.450–4.500)

## 2017-06-22 NOTE — Progress Notes (Signed)
Normal labs.  Pt notified through mychart

## 2018-01-01 DIAGNOSIS — K259 Gastric ulcer, unspecified as acute or chronic, without hemorrhage or perforation: Secondary | ICD-10-CM | POA: Insufficient documentation

## 2018-04-25 NOTE — Telephone Encounter (Signed)
Pt requesting dermatology referral. Did message patient back asking for further information. She did mention a rash at last visit with Dr. Jamesetta Orleans, DNP stating, "Pt states he has a rash that comes and goes.  Husband as the same rash.  States the rash comes up somewhere in the body.  Describes it as dots and it itches."  Can this referral be placed or does she need to be seen? Please advise.

## 2018-05-03 DIAGNOSIS — R200 Anesthesia of skin: Secondary | ICD-10-CM | POA: Insufficient documentation

## 2018-06-22 ENCOUNTER — Ambulatory Visit (INDEPENDENT_AMBULATORY_CARE_PROVIDER_SITE_OTHER): Payer: 59 | Admitting: Unknown Physician Specialty

## 2018-06-22 ENCOUNTER — Encounter: Payer: Self-pay | Admitting: Unknown Physician Specialty

## 2018-06-22 VITALS — BP 115/85 | HR 66 | Temp 98.2°F | Ht 66.54 in | Wt 155.4 lb

## 2018-06-22 DIAGNOSIS — N750 Cyst of Bartholin's gland: Secondary | ICD-10-CM

## 2018-06-22 MED ORDER — AMOXICILLIN 875 MG PO TABS: 875.0000 mg | ORAL_TABLET | Freq: Two times a day (BID) | ORAL | 0 refills | Status: DC

## 2018-06-22 NOTE — Progress Notes (Signed)
BP 115/85 (BP Location: Left Arm, Patient Position: Sitting, Cuff Size: Normal)   Pulse 66   Temp 98.2 F (36.8 C)   Ht 5' 6.54" (1.69 m)   Wt 155 lb 6 oz (70.5 kg)   SpO2 100%   BMI 24.68 kg/m    Subjective:    Patient ID: Laura Carey, female    DOB: [birth year], 32 y.o.   MRN: [id number]  HPI: Laura Carey is a 32 y.o. female  Chief Complaint  Patient presents with  . Annual Exam   Pt is here for general history and physical.  Pt states she just had her 3 months post-partum check-up.  States she is still having some problems with her grade 2 tear and wants it checked.  States it is painful when she sits down on the left side.  No discharge.    Relevant past medical, surgical, family and social history reviewed and updated as indicated. Interim medical history since our last visit reviewed. Allergies and medications reviewed and updated.  Review of Systems  Constitutional: Negative.   HENT: Negative.   Eyes: Negative.   Respiratory: Negative.   Cardiovascular: Negative.   Gastrointestinal: Negative.   Endocrine: Negative.   Genitourinary: Positive for genital sores.  Musculoskeletal: Negative.   Skin: Negative.   Allergic/Immunologic: Negative.   Neurological: Negative.   Hematological: Negative.   Psychiatric/Behavioral: Negative.     Per HPI unless specifically indicated above     Objective:    BP 115/85 (BP Location: Left Arm, Patient Position: Sitting, Cuff Size: Normal)   Pulse 66   Temp 98.2 F (36.8 C)   Ht 5' 6.54" (1.69 m)   Wt 155 lb 6 oz (70.5 kg)   SpO2 100%   BMI 24.68 kg/m   Wt Readings from Last 3 Encounters:  06/22/18 155 lb 6 oz (70.5 kg)  06/21/17 152 lb 8 oz (69.2 kg)  09/28/16 162 lb (73.5 kg)    Physical Exam  Constitutional: She is oriented to person, place, and time. She appears well-developed and well-nourished. She appears distressed.  HENT:  Head: Normocephalic and atraumatic.  Eyes: Conjunctivae and lids are normal.  Right eye exhibits no discharge. Left eye exhibits no discharge. No scleral icterus.  Neck: Normal range of motion. Neck supple. No JVD present. Carotid bruit is not present.  Cardiovascular: Normal rate, regular rhythm and normal heart sounds.  Pulmonary/Chest: Effort normal and breath sounds normal.  Abdominal: Normal appearance. There is no splenomegaly or hepatomegaly.  Genitourinary:  Genitourinary Comments: Left enlarged Bartholin cyst.  Area cleaned with Betadine, numbed with lidocaine and incised with a #11 blade.  Drained a large amount of purulent and serosanguinous dressing.    Musculoskeletal: Normal range of motion.  Neurological: She is alert and oriented to person, place, and time.  Skin: Skin is warm, dry and intact. No rash noted. No pallor.  Psychiatric: She has a normal mood and affect. Her behavior is normal. Judgment and thought content normal.    Results for orders placed or performed in visit on 06/21/17  CBC with Differential/Platelet  Result Value Ref Range   WBC 7.3 3.4 - 10.8 x10E3/uL   RBC 4.37 3.77 - 5.28 x10E6/uL   Hemoglobin 12.8 11.1 - 15.9 g/dL   Hematocrit 40.9 81.1 - 46.6 %   MCV 90 79 - 97 fL   MCH 29.3 26.6 - 33.0 pg   MCHC 32.6 31.5 - 35.7 g/dL   RDW 91.4 78.2 - 95.6 %  Platelets 261 150 - 379 x10E3/uL   Neutrophils 58 Not Estab. %   Lymphs 34 Not Estab. %   Monocytes 5 Not Estab. %   Eos 2 Not Estab. %   Basos 1 Not Estab. %   Neutrophils Absolute 4.3 1.4 - 7.0 x10E3/uL   Lymphocytes Absolute 2.4 0.7 - 3.1 x10E3/uL   Monocytes Absolute 0.4 0.1 - 0.9 x10E3/uL   EOS (ABSOLUTE) 0.2 0.0 - 0.4 x10E3/uL   Basophils Absolute 0.1 0.0 - 0.2 x10E3/uL   Immature Granulocytes 0 Not Estab. %   Immature Grans (Abs) 0.0 0.0 - 0.1 x10E3/uL  Comprehensive metabolic panel  Result Value Ref Range   Glucose 92 65 - 99 mg/dL   BUN 13 6 - 20 mg/dL   Creatinine, Ser 1.61 0.57 - 1.00 mg/dL   GFR calc non Af Amer 93 >59 mL/min/1.73   GFR calc Af Amer 107 >59  mL/min/1.73   BUN/Creatinine Ratio 15 9 - 23   Sodium 143 134 - 144 mmol/L   Potassium 4.9 3.5 - 5.2 mmol/L   Chloride 103 96 - 106 mmol/L   CO2 28 20 - 29 mmol/L   Calcium 9.5 8.7 - 10.2 mg/dL   Total Protein 7.2 6.0 - 8.5 g/dL   Albumin 4.7 3.5 - 5.5 g/dL   Globulin, Total 2.5 1.5 - 4.5 g/dL   Albumin/Globulin Ratio 1.9 1.2 - 2.2   Bilirubin Total 0.8 0.0 - 1.2 mg/dL   Alkaline Phosphatase 53 39 - 117 IU/L   AST 13 0 - 40 IU/L   ALT 11 0 - 32 IU/L  Lipid Panel w/o Chol/HDL Ratio  Result Value Ref Range   Cholesterol, Total 136 100 - 199 mg/dL   Triglycerides 096 0 - 149 mg/dL   HDL 51 >04 mg/dL   VLDL Cholesterol Cal 22 5 - 40 mg/dL   LDL Calculated 63 0 - 99 mg/dL  TSH  Result Value Ref Range   TSH 1.410 0.450 - 4.500 uIU/mL      Assessment & Plan:   Problem List Items Addressed This Visit    None    Visit Diagnoses    Bartholin cyst    -  Primary   Instructed to use sitz bath TID.  Call gyn first thing on Monday to schedule a follow-up.  Rx for Amoxil as she is breast feeding.         Follow up plan: Return for RTC for physical.  Unable to do today.

## 2018-06-22 NOTE — Patient Instructions (Signed)
Bartholin Cyst or Abscess A Bartholin cyst is a fluid-filled sac that forms on a Bartholin gland. Bartholin glands are small glands that are located within the folds of skin (labia) along the sides of the lower opening of the vagina. These glands produce a fluid to moisten the outside of the vagina during sexual intercourse. A Bartholin cyst causes a bulge on the side of the vagina. A cyst that is not large or infected may not cause symptoms or problems. However, if the fluid within the cyst becomes infected, the cyst can turn into an abscess. An abscess may cause discomfort or pain. What are the causes? A Bartholin cyst may develop when the duct of the gland becomes blocked. In many cases, the cause of this is not known. Various kinds of bacteria can cause the cyst to become infected and develop into an abscess. What increases the risk? You may be at an increased risk of developing a Bartholin cyst or abscess if:  You are a woman of reproductive age.  You have a history of previous Bartholin cysts or abscesses.  You have diabetes.  You have a sexually transmitted disease (STD).  What are the signs or symptoms? The severity of symptoms varies depending on the size of the cyst and whether it is infected. Symptoms may include:  A bulge or swelling near the lower opening of your vagina.  Discomfort or pain.  Redness.  Pain during sexual intercourse.  Pain when walking.  Fluid draining from the area.  How is this diagnosed? Your health care provider may make a diagnosis based on your symptoms and a physical exam. He or she will look for swelling in your vaginal area. Blood tests may be done to check for infections. A sample of fluid from the cyst or abscess may also be taken to be tested in a lab. How is this treated? Small cysts that are not infected may not require any treatment. These often go away on their own. Yourhealth care provider will recommend hot baths and the use of warm  compresses. These may also be part of the treatment for an abscess. Treatment options for a large cyst or abscess may include:  Antibiotic medicine.  A surgical procedure to drain the abscess. One of the following procedures may be done: ? Incision and drainage. An incision is made in the cyst or abscess so that the fluid drains out. A catheter may be placed inside the cyst so that it does not close and fill up with fluid again. The catheter will be removed after you have a follow-up visit with a specialist (gynecologist). ? Marsupialization. The cyst or abscess is opened and kept open by stitching the edges of the skin to the walls of the cyst or abscess. This allows it to continue to drain and not fill up with fluid again.  If you have cysts or abscesses that keep returning and have required incision and drainage multiple times, your health care provider may talk to you about surgery to remove the Bartholin gland. Follow these instructions at home:  Take medicines only as directed by your health care provider.  If you were prescribed an antibiotic medicine, finish it all even if you start to feel better.  Apply warm, wet compresses to the area or take warm, shallow baths that cover your pelvic region (sitz baths) several times a day or as directed by your health care provider.  Do not squeeze the cyst or apply heavy pressure to it.    Do not have sexual intercourse until the cyst has gone away.  If your cyst or abscess was opened, a small piece of gauze or a drain may have been placed in the area to allow drainage. Do not remove the gauze or the drain until directed by your health care provider.  Wear feminine pads-not tampons-as needed for any drainage or bleeding.  Keep all follow-up visits as directed by your health care provider. This is important. How is this prevented? Take these steps to help prevent a Bartholin cyst from returning:  Practice good hygiene.  Clean your vaginal  area with mild soap and a soft cloth when you bathe.  Practice safe sex to prevent STDs.  Contact a health care provider if:  You have increased pain, swelling, or redness in the area of the cyst.  Puslike drainage is coming from the cyst.  You have a fever. This information is not intended to replace advice given to you by your health care provider. Make sure you discuss any questions you have with your health care provider. Document Released: 08/01/2005 Document Revised: 01/07/2016 Document Reviewed: 03/17/2014 Elsevier Interactive Patient Education  2018 [employer]  

## 2018-09-24 ENCOUNTER — Encounter: Payer: Self-pay | Admitting: Nurse Practitioner

## 2018-09-24 ENCOUNTER — Ambulatory Visit: Payer: 59 | Admitting: Nurse Practitioner

## 2018-09-24 ENCOUNTER — Other Ambulatory Visit: Payer: Self-pay

## 2018-09-24 DIAGNOSIS — J069 Acute upper respiratory infection, unspecified: Secondary | ICD-10-CM

## 2018-09-24 MED ORDER — FLUTICASONE PROPIONATE 50 MCG/ACT NA SUSP: 2.0000 | Freq: Every day | NASAL | 2 refills | Status: DC

## 2018-09-24 NOTE — Assessment & Plan Note (Signed)
Acute, simple treatment at this time as is breastfeeding.  Flonase script sent in for postnasal drainage.  Recommend use of OTC Claritin or Zyrtec for nasal symptoms.   - Increased rest - Increasing Fluids - Acetaminophen / ibuprofen as needed for fever/pain.  - Salt water gargling, chloraseptic spray and throat lozenges - Saline sinus flushes or a neti pot.  - Humidifying the air. - Return for worsening or continued symptoms

## 2018-09-24 NOTE — Patient Instructions (Signed)

## 2018-09-24 NOTE — Progress Notes (Signed)
BP 104/72   Pulse 66   Temp 98.7 F (37.1 C) (Oral)   Ht 5' 6.5" (1.689 m)   Wt 152 lb (68.9 kg)   SpO2 98%   BMI 24.17 kg/m    Subjective:    Patient ID: Laura Carey, female    DOB: [birth year], 33 y.o.   MRN: [id number]  HPI: Laura Carey is a 33 y.o. female  Chief Complaint  Patient presents with  . Cough    since last friday  . Nasal Congestion  . Sore Throat    scratchy throat   UPPER RESPIRATORY TRACT INFECTION Symptoms started on Friday, sore throat and congestion.  Previous URI was over a month ago. Worst symptom: sore throat Fever: no Cough: yes Shortness of breath: no Wheezing: no Chest pain: none Chest tightness: no Chest congestion: no Nasal congestion: yes Runny nose: no Post nasal drip: yes Sneezing: no Sore throat: yes Swollen glands: no Sinus pressure: no Headache: yes Face pain: no Toothache: no Ear pain: none Ear pressure: none Eyes red/itching:no Eye drainage/crusting: no  Vomiting: no Rash: no Fatigue: yes Sick contacts: no Strep contacts: no  Context: fluctuating Recurrent sinusitis: no Relief with OTC cold/cough medications: no  Treatments attempted: Ibuprofen and Robitussin , was taking leftover Amoxicillin at home  Relevant past medical, surgical, family and social history reviewed and updated as indicated. Interim medical history since our last visit reviewed. Allergies and medications reviewed and updated.  Review of Systems  Constitutional: Positive for fatigue. Negative for activity change, appetite change, diaphoresis and fever.  HENT: Positive for congestion and sore throat. Negative for ear discharge, ear pain, postnasal drip, rhinorrhea, sinus pressure, sinus pain, sneezing and voice change.   Eyes: Negative for pain and visual disturbance.  Respiratory: Positive for cough. Negative for chest tightness and shortness of breath.   Cardiovascular: Negative for chest pain, palpitations and leg swelling.    Gastrointestinal: Negative for abdominal distention, abdominal pain, constipation, diarrhea, nausea and vomiting.  Endocrine: Negative for cold intolerance, heat intolerance, polydipsia, polyphagia and polyuria.  Musculoskeletal: Negative for myalgias.  Neurological: Negative for dizziness, syncope, weakness, light-headedness, numbness and headaches.  Psychiatric/Behavioral: Negative.     Per HPI unless specifically indicated above     Objective:    BP 104/72   Pulse 66   Temp 98.7 F (37.1 C) (Oral)   Ht 5' 6.5" (1.689 m)   Wt 152 lb (68.9 kg)   SpO2 98%   BMI 24.17 kg/m   Wt Readings from Last 3 Encounters:  09/24/18 152 lb (68.9 kg)  06/22/18 155 lb 6 oz (70.5 kg)  06/21/17 152 lb 8 oz (69.2 kg)    Physical Exam Vitals signs and nursing note reviewed.  Constitutional:      General: She is awake.     Appearance: She is well-developed. She is not ill-appearing.  HENT:     Head: Normocephalic.     Right Ear: Hearing, ear canal and external ear normal. No drainage. A middle ear effusion is present.     Left Ear: Hearing, ear canal and external ear normal. No drainage. A middle ear effusion is present.     Nose: Mucosal edema and rhinorrhea present. Rhinorrhea is clear.     Right Sinus: No maxillary sinus tenderness or frontal sinus tenderness.     Left Sinus: No maxillary sinus tenderness or frontal sinus tenderness.     Mouth/Throat:     Mouth: Mucous membranes are moist.     Pharynx:  Oropharynx is clear. Posterior oropharyngeal erythema (mild with cobblestoning) present. No pharyngeal swelling or oropharyngeal exudate.  Eyes:     General: Lids are normal.        Right eye: No discharge.        Left eye: No discharge.     Conjunctiva/sclera: Conjunctivae normal.     Pupils: Pupils are equal, round, and reactive to light.  Neck:     Musculoskeletal: Normal range of motion and neck supple.     Thyroid: No thyromegaly.     Vascular: No carotid bruit or JVD.   Cardiovascular:     Rate and Rhythm: Normal rate and regular rhythm.     Heart sounds: Normal heart sounds. No murmur. No gallop.   Pulmonary:     Effort: Pulmonary effort is normal.     Breath sounds: Normal breath sounds.     Comments: Clear throughout Abdominal:     General: Bowel sounds are normal.     Palpations: Abdomen is soft. There is no hepatomegaly or splenomegaly.  Musculoskeletal:     Right lower leg: No edema.     Left lower leg: No edema.  Lymphadenopathy:     Head:     Right side of head: No submental, submandibular, tonsillar or preauricular adenopathy.     Left side of head: No submental, submandibular, tonsillar or preauricular adenopathy.     Cervical: No cervical adenopathy.  Skin:    General: Skin is warm and dry.  Neurological:     Mental Status: She is alert and oriented to person, place, and time.  Psychiatric:        Attention and Perception: Attention normal.        Mood and Affect: Mood normal.        Behavior: Behavior normal. Behavior is cooperative.        Thought Content: Thought content normal.        Judgment: Judgment normal.     Results for orders placed or performed in visit on 06/21/17  CBC with Differential/Platelet  Result Value Ref Range   WBC 7.3 3.4 - 10.8 x10E3/uL   RBC 4.37 3.77 - 5.28 x10E6/uL   Hemoglobin 12.8 11.1 - 15.9 g/dL   Hematocrit 16.139.3 09.634.0 - 46.6 %   MCV 90 79 - 97 fL   MCH 29.3 26.6 - 33.0 pg   MCHC 32.6 31.5 - 35.7 g/dL   RDW 04.512.9 40.912.3 - 81.115.4 %   Platelets 261 150 - 379 x10E3/uL   Neutrophils 58 Not Estab. %   Lymphs 34 Not Estab. %   Monocytes 5 Not Estab. %   Eos 2 Not Estab. %   Basos 1 Not Estab. %   Neutrophils Absolute 4.3 1.4 - 7.0 x10E3/uL   Lymphocytes Absolute 2.4 0.7 - 3.1 x10E3/uL   Monocytes Absolute 0.4 0.1 - 0.9 x10E3/uL   EOS (ABSOLUTE) 0.2 0.0 - 0.4 x10E3/uL   Basophils Absolute 0.1 0.0 - 0.2 x10E3/uL   Immature Granulocytes 0 Not Estab. %   Immature Grans (Abs) 0.0 0.0 - 0.1 x10E3/uL   Comprehensive metabolic panel  Result Value Ref Range   Glucose 92 65 - 99 mg/dL   BUN 13 6 - 20 mg/dL   Creatinine, Ser 9.140.84 0.57 - 1.00 mg/dL   GFR calc non Af Amer 93 >59 mL/min/1.73   GFR calc Af Amer 107 >59 mL/min/1.73   BUN/Creatinine Ratio 15 9 - 23   Sodium 143 134 - 144 mmol/L   Potassium  4.9 3.5 - 5.2 mmol/L   Chloride 103 96 - 106 mmol/L   CO2 28 20 - 29 mmol/L   Calcium 9.5 8.7 - 10.2 mg/dL   Total Protein 7.2 6.0 - 8.5 g/dL   Albumin 4.7 3.5 - 5.5 g/dL   Globulin, Total 2.5 1.5 - 4.5 g/dL   Albumin/Globulin Ratio 1.9 1.2 - 2.2   Bilirubin Total 0.8 0.0 - 1.2 mg/dL   Alkaline Phosphatase 53 39 - 117 IU/L   AST 13 0 - 40 IU/L   ALT 11 0 - 32 IU/L  Lipid Panel w/o Chol/HDL Ratio  Result Value Ref Range   Cholesterol, Total 136 100 - 199 mg/dL   Triglycerides 956 0 - 149 mg/dL   HDL 51 >21 mg/dL   VLDL Cholesterol Cal 22 5 - 40 mg/dL   LDL Calculated 63 0 - 99 mg/dL  TSH  Result Value Ref Range   TSH 1.410 0.450 - 4.500 uIU/mL      Assessment & Plan:   Problem List Items Addressed This Visit      Respiratory   Upper respiratory infection    Acute, simple treatment at this time as is breastfeeding.  Flonase script sent in for postnasal drainage.  Recommend use of OTC Claritin or Zyrtec for nasal symptoms.   - Increased rest - Increasing Fluids - Acetaminophen / ibuprofen as needed for fever/pain.  - Salt water gargling, chloraseptic spray and throat lozenges - Saline sinus flushes or a neti pot.  - Humidifying the air. - Return for worsening or continued symptoms          Follow up plan: Return if symptoms worsen or fail to improve.

## 2019-06-23 ENCOUNTER — Encounter: Payer: Self-pay | Admitting: Nurse Practitioner

## 2019-06-28 ENCOUNTER — Ambulatory Visit (INDEPENDENT_AMBULATORY_CARE_PROVIDER_SITE_OTHER): Payer: BC Managed Care – PPO | Admitting: Nurse Practitioner

## 2019-06-28 ENCOUNTER — Encounter: Payer: Self-pay | Admitting: Nurse Practitioner

## 2019-06-28 ENCOUNTER — Other Ambulatory Visit: Payer: Self-pay

## 2019-06-28 VITALS — BP 107/73 | HR 64 | Temp 98.8°F | Ht 65.5 in | Wt 153.0 lb

## 2019-06-28 DIAGNOSIS — Z0000 Encounter for general adult medical examination without abnormal findings: Secondary | ICD-10-CM | POA: Diagnosis not present

## 2019-06-28 DIAGNOSIS — Z136 Encounter for screening for cardiovascular disorders: Secondary | ICD-10-CM | POA: Diagnosis not present

## 2019-06-28 NOTE — Patient Instructions (Signed)
 Health Maintenance, Female Adopting a healthy lifestyle and getting preventive care are important in promoting health and wellness. Ask your health care provider about:  The right schedule for you to have regular tests and exams.  Things you can do on your own to prevent diseases and keep yourself healthy. What should I know about diet, weight, and exercise? Eat a healthy diet   Eat a diet that includes plenty of vegetables, fruits, low-fat dairy products, and lean protein.  Do not eat a lot of foods that are high in solid fats, added sugars, or sodium. Maintain a healthy weight Body mass index (BMI) is used to identify weight problems. It estimates body fat based on height and weight. Your health care provider can help determine your BMI and help you achieve or maintain a healthy weight. Get regular exercise Get regular exercise. This is one of the most important things you can do for your health. Most adults should:  Exercise for at least 150 minutes each week. The exercise should increase your heart rate and make you sweat (moderate-intensity exercise).  Do strengthening exercises at least twice a week. This is in addition to the moderate-intensity exercise.  Spend less time sitting. Even light physical activity can be beneficial. Watch cholesterol and blood lipids Have your blood tested for lipids and cholesterol at 33 years of age, then have this test every 5 years. Have your cholesterol levels checked more often if:  Your lipid or cholesterol levels are high.  You are older than 33 years of age.  You are at high risk for heart disease. What should I know about cancer screening? Depending on your health history and family history, you may need to have cancer screening at various ages. This may include screening for:  Breast cancer.  Cervical cancer.  Colorectal cancer.  Skin cancer.  Lung cancer. What should I know about heart disease, diabetes, and high blood  pressure? Blood pressure and heart disease  High blood pressure causes heart disease and increases the risk of stroke. This is more likely to develop in people who have high blood pressure readings, are of African descent, or are overweight.  Have your blood pressure checked: ? Every 3-5 years if you are 18-39 years of age. ? Every year if you are 40 years old or older. Diabetes Have regular diabetes screenings. This checks your fasting blood sugar level. Have the screening done:  Once every three years after age 40 if you are at a normal weight and have a low risk for diabetes.  More often and at a younger age if you are overweight or have a high risk for diabetes. What should I know about preventing infection? Hepatitis B If you have a higher risk for hepatitis B, you should be screened for this virus. Talk with your health care provider to find out if you are at risk for hepatitis B infection. Hepatitis C Testing is recommended for:  Everyone born from 1945 through 1965.  Anyone with known risk factors for hepatitis C. Sexually transmitted infections (STIs)  Get screened for STIs, including gonorrhea and chlamydia, if: ? You are sexually active and are younger than 33 years of age. ? You are older than 33 years of age and your health care provider tells you that you are at risk for this type of infection. ? Your sexual activity has changed since you were last screened, and you are at increased risk for chlamydia or gonorrhea. Ask your health care provider   if you are at risk.  Ask your health care provider about whether you are at high risk for HIV. Your health care provider may recommend a prescription medicine to help prevent HIV infection. If you choose to take medicine to prevent HIV, you should first get tested for HIV. You should then be tested every 3 months for as long as you are taking the medicine. Pregnancy  If you are about to stop having your period (premenopausal) and  you may become pregnant, seek counseling before you get pregnant.  Take 400 to 800 micrograms (mcg) of folic acid every day if you become pregnant.  Ask for birth control (contraception) if you want to prevent pregnancy. Osteoporosis and menopause Osteoporosis is a disease in which the bones lose minerals and strength with aging. This can result in bone fractures. If you are 65 years old or older, or if you are at risk for osteoporosis and fractures, ask your health care provider if you should:  Be screened for bone loss.  Take a calcium or vitamin D supplement to lower your risk of fractures.  Be given hormone replacement therapy (HRT) to treat symptoms of menopause. Follow these instructions at home: Lifestyle  Do not use any products that contain nicotine or tobacco, such as cigarettes, e-cigarettes, and chewing tobacco. If you need help quitting, ask your health care provider.  Do not use street drugs.  Do not share needles.  Ask your health care provider for help if you need support or information about quitting drugs. Alcohol use  Do not drink alcohol if: ? Your health care provider tells you not to drink. ? You are pregnant, may be pregnant, or are planning to become pregnant.  If you drink alcohol: ? Limit how much you use to 0-1 drink a day. ? Limit intake if you are breastfeeding.  Be aware of how much alcohol is in your drink. In the [place] one drink equals one 12 oz bottle of beer (355 mL), one 5 oz glass of wine (148 mL), or one 1 oz glass of hard liquor (44 mL). General instructions  Schedule regular health, dental, and eye exams.  Stay current with your vaccines.  Tell your health care provider if: ? You often feel depressed. ? You have ever been abused or do not feel safe at home. Summary  Adopting a healthy lifestyle and getting preventive care are important in promoting health and wellness.  Follow your health care provider's instructions about healthy  diet, exercising, and getting tested or screened for diseases.  Follow your health care provider's instructions on monitoring your cholesterol and blood pressure. This information is not intended to replace advice given to you by your health care provider. Make sure you discuss any questions you have with your health care provider. Document Released: 02/14/2011 Document Revised: 07/25/2018 Document Reviewed: 07/25/2018 Elsevier Patient Education  2020 [employer]  American Heart Association (AHA) Exercise Recommendation  Being physically active is important to prevent heart disease and stroke, the nation's No. 1and No. 5killers. To improve overall cardiovascular health, we suggest at least 150 minutes per week of moderate exercise or 75 minutes per week of vigorous exercise (or a combination of moderate and vigorous activity). Thirty minutes a day, five times a week is an easy goal to remember. You will also experience benefits even if you divide your time into two or three segments of 10 to 15 minutes per day.  For people who would benefit from lowering their blood pressure   or cholesterol, we recommend 40 minutes of aerobic exercise of moderate to vigorous intensity three to four times a week to lower the risk for heart attack and stroke.  Physical activity is anything that makes you move your body and burn calories.  This includes things like climbing stairs or playing sports. Aerobic exercises benefit your heart, and include walking, jogging, swimming or biking. Strength and stretching exercises are best for overall stamina and flexibility.  The simplest, positive change you can make to effectively improve your heart health is to start walking. It's enjoyable, free, easy, social and great exercise. A walking program is flexible and boasts high success rates because people can stick with it. It's easy for walking to become a regular and satisfying part of life.   For Overall Cardiovascular  Health:  At least 30 minutes of moderate-intensity aerobic activity at least 5 days per week for a total of 150  OR   At least 25 minutes of vigorous aerobic activity at least 3 days per week for a total of 75 minutes; or a combination of moderate- and vigorous-intensity aerobic activity  AND   Moderate- to high-intensity muscle-strengthening activity at least 2 days per week for additional health benefits.  For Lowering Blood Pressure and Cholesterol  An average 40 minutes of moderate- to vigorous-intensity aerobic activity 3 or 4 times per week  What if I can't make it to the time goal? Something is always better than nothing! And everyone has to start somewhere. Even if you've been sedentary for years, today is the day you can begin to make healthy changes in your life. If you don't think you'll make it for 30 or 40 minutes, set a reachable goal for today. You can work up toward your overall goal by increasing your time as you get stronger. Don't let all-or-nothing thinking rob you of doing what you can every day.  Source:http://www.heart.org    

## 2019-06-28 NOTE — Progress Notes (Signed)
BP 107/73   Pulse 64   Temp 98.8 F (37.1 C) (Oral)   Ht 5' 5.5" (1.664 m)   Wt 153 lb (69.4 kg)   SpO2 100%   BMI 25.07 kg/m    Subjective:    Patient ID: Laura Carey, female    DOB: 07/30/1986, 33 y.o.   MRN: [id number]  HPI: Laura Carey is a 33 y.o. female presenting on 06/28/2019 for comprehensive medical examination. Current medical complaints include:none  She currently lives with: husband Menopausal Symptoms: no   Functional Status Survey: Is the patient deaf or have difficulty hearing?: No Does the patient have difficulty seeing, even when wearing glasses/contacts?: No Does the patient have difficulty concentrating, remembering, or making decisions?: No Does the patient have difficulty walking or climbing stairs?: No Does the patient have difficulty dressing or bathing?: No Does the patient have difficulty doing errands alone such as visiting a doctor's office or shopping?: No  Depression Screen done today and results listed below:  Depression screen [facility] 2/9 06/28/2019 06/22/2018 06/21/2017 06/10/2016  Decreased Interest 0 0 0 1  Down, Depressed, Hopeless 0 3 0 0  PHQ - 2 Score 0 3 0 1  Altered sleeping 0 0 0 -  Tired, decreased energy 1 0 0 -  Change in appetite 1 0 0 -  Feeling bad or failure about yourself  0 0 0 -  Trouble concentrating 0 0 0 -  Moving slowly or fidgety/restless 0 0 0 -  Suicidal thoughts 0 0 0 -  PHQ-9 Score 2 3 0 -  Difficult doing work/chores Not difficult at all Not difficult at all - -   GAD 7 : Generalized Anxiety Score 06/28/2019  Nervous, Anxious, on Edge 0  Control/stop worrying 0  Worry too much - different things 1  Trouble relaxing 1  Restless 0  Easily annoyed or irritable 1  Afraid - awful might happen 0  Total GAD 7 Score 3  Anxiety Difficulty Not difficult at all    The patient does not have a history of falls. I did not complete a risk assessment for falls. A plan of care for falls was not documented.   Past  Medical History:  History reviewed. No pertinent past medical history.  Surgical History:  Past Surgical History:  Procedure Laterality Date  . HYSTEROSCOPY  01/2017    Medications:  No current outpatient medications on file prior to visit.   No current facility-administered medications on file prior to visit.     Allergies:  No Known Allergies  Social History:  Social History   Socioeconomic History  . Marital status: Married    Spouse name: Not on file  . Number of children: Not on file  . Years of education: Not on file  . Highest education level: Not on file  Occupational History  . Not on file  Social Needs  . Financial resource strain: Not on file  . Food insecurity    Worry: Not on file    Inability: Not on file  . Transportation needs    Medical: Not on file    Non-medical: Not on file  Tobacco Use  . Smoking status: Never Smoker  . Smokeless tobacco: Never Used  Substance and Sexual Activity  . Alcohol use: No  . Drug use: No  . Sexual activity: Not Currently  Lifestyle  . Physical activity    Days per week: Not on file    Minutes per session: Not on file  .  Stress: Not on file  Relationships  . Social Musician on phone: Not on file    Gets together: Not on file    Attends religious service: Not on file    Active member of club or organization: Not on file    Attends meetings of clubs or organizations: Not on file    Relationship status: Not on file  . Intimate partner violence    Fear of current or ex partner: Not on file    Emotionally abused: Not on file    Physically abused: Not on file    Forced sexual activity: Not on file  Other Topics Concern  . Not on file  Social History Narrative  . Not on file   Social History   Tobacco Use  Smoking Status Never Smoker  Smokeless Tobacco Never Used   Social History   Substance and Sexual Activity  Alcohol Use No    Family History:  Family History  Problem Relation Age of  Onset  . Cancer Maternal Grandmother        breast    Past medical history, surgical history, medications, allergies, family history and social history reviewed with patient today and changes made to appropriate areas of the chart.   Review of Systems - negative All other ROS negative except what is listed above and in the HPI.      Objective:    BP 107/73   Pulse 64   Temp 98.8 F (37.1 C) (Oral)   Ht 5' 5.5" (1.664 m)   Wt 153 lb (69.4 kg)   SpO2 100%   BMI 25.07 kg/m   Wt Readings from Last 3 Encounters:  06/28/19 153 lb (69.4 kg)  09/24/18 152 lb (68.9 kg)  06/22/18 155 lb 6 oz (70.5 kg)    Physical Exam Constitutional:      General: She is awake. She is not in acute distress.    Appearance: She is well-developed. She is not ill-appearing.  HENT:     Head: Normocephalic and atraumatic.     Right Ear: Hearing, tympanic membrane, ear canal and external ear normal. No drainage.     Left Ear: Hearing, tympanic membrane, ear canal and external ear normal. No drainage.     Nose: Nose normal.     Right Sinus: No maxillary sinus tenderness or frontal sinus tenderness.     Left Sinus: No maxillary sinus tenderness or frontal sinus tenderness.     Mouth/Throat:     Mouth: Mucous membranes are moist.     Pharynx: Oropharynx is clear. Uvula midline. No pharyngeal swelling, oropharyngeal exudate or posterior oropharyngeal erythema.  Eyes:     General: Lids are normal.        Right eye: No discharge.        Left eye: No discharge.     Extraocular Movements: Extraocular movements intact.     Conjunctiva/sclera: Conjunctivae normal.     Pupils: Pupils are equal, round, and reactive to light.     Visual Fields: Right eye visual fields normal and left eye visual fields normal.  Neck:     Musculoskeletal: Normal range of motion and neck supple.     Thyroid: No thyromegaly.     Vascular: No carotid bruit.     Trachea: Trachea normal.  Cardiovascular:     Rate and Rhythm: Normal  rate and regular rhythm.     Heart sounds: Normal heart sounds. No murmur. No gallop.   Pulmonary:  Effort: Pulmonary effort is normal. No accessory muscle usage or respiratory distress.     Breath sounds: Normal breath sounds.  Chest:     Breasts:        Right: Normal.        Left: Normal.  Abdominal:     General: Bowel sounds are normal.     Palpations: Abdomen is soft. There is no hepatomegaly or splenomegaly.     Tenderness: There is no abdominal tenderness.  Musculoskeletal: Normal range of motion.     Right lower leg: No edema.     Left lower leg: No edema.  Lymphadenopathy:     Head:     Right side of head: No submental, submandibular, tonsillar, preauricular or posterior auricular adenopathy.     Left side of head: No submental, submandibular, tonsillar, preauricular or posterior auricular adenopathy.     Cervical: No cervical adenopathy.     Upper Body:     Right upper body: No supraclavicular, axillary or pectoral adenopathy.     Left upper body: No supraclavicular, axillary or pectoral adenopathy.  Skin:    General: Skin is warm and dry.     Capillary Refill: Capillary refill takes less than 2 seconds.     Findings: No rash.  Neurological:     Mental Status: She is alert and oriented to person, place, and time.     Cranial Nerves: Cranial nerves are intact.     Gait: Gait is intact.     Deep Tendon Reflexes: Reflexes are normal and symmetric.     Reflex Scores:      Brachioradialis reflexes are 2+ on the right side and 2+ on the left side.      Patellar reflexes are 2+ on the right side and 2+ on the left side. Psychiatric:        Attention and Perception: Attention normal.        Mood and Affect: Mood normal.        Speech: Speech normal.        Behavior: Behavior normal. Behavior is cooperative.        Thought Content: Thought content normal.        Judgment: Judgment normal.    Results for orders placed or performed in visit on 06/21/17  CBC with  Differential/Platelet  Result Value Ref Range   WBC 7.3 3.4 - 10.8 x10E3/uL   RBC 4.37 3.77 - 5.28 x10E6/uL   Hemoglobin 12.8 11.1 - 15.9 g/dL   Hematocrit 03.5 00.9 - 46.6 %   MCV 90 79 - 97 fL   MCH 29.3 26.6 - 33.0 pg   MCHC 32.6 31.5 - 35.7 g/dL   RDW 38.1 82.9 - 93.7 %   Platelets 261 150 - 379 x10E3/uL   Neutrophils 58 Not Estab. %   Lymphs 34 Not Estab. %   Monocytes 5 Not Estab. %   Eos 2 Not Estab. %   Basos 1 Not Estab. %   Neutrophils Absolute 4.3 1.4 - 7.0 x10E3/uL   Lymphocytes Absolute 2.4 0.7 - 3.1 x10E3/uL   Monocytes Absolute 0.4 0.1 - 0.9 x10E3/uL   EOS (ABSOLUTE) 0.2 0.0 - 0.4 x10E3/uL   Basophils Absolute 0.1 0.0 - 0.2 x10E3/uL   Immature Granulocytes 0 Not Estab. %   Immature Grans (Abs) 0.0 0.0 - 0.1 x10E3/uL  Comprehensive metabolic panel  Result Value Ref Range   Glucose 92 65 - 99 mg/dL   BUN 13 6 - 20 mg/dL   Creatinine,  Ser 0.84 0.57 - 1.00 mg/dL   GFR calc non Af Amer 93 >59 mL/min/1.73   GFR calc Af Amer 107 >59 mL/min/1.73   BUN/Creatinine Ratio 15 9 - 23   Sodium 143 134 - 144 mmol/L   Potassium 4.9 3.5 - 5.2 mmol/L   Chloride 103 96 - 106 mmol/L   CO2 28 20 - 29 mmol/L   Calcium 9.5 8.7 - 10.2 mg/dL   Total Protein 7.2 6.0 - 8.5 g/dL   Albumin 4.7 3.5 - 5.5 g/dL   Globulin, Total 2.5 1.5 - 4.5 g/dL   Albumin/Globulin Ratio 1.9 1.2 - 2.2   Bilirubin Total 0.8 0.0 - 1.2 mg/dL   Alkaline Phosphatase 53 39 - 117 IU/L   AST 13 0 - 40 IU/L   ALT 11 0 - 32 IU/L  Lipid Panel w/o Chol/HDL Ratio  Result Value Ref Range   Cholesterol, Total 136 100 - 199 mg/dL   Triglycerides 108 0 - 149 mg/dL   HDL 51 >39 mg/dL   VLDL Cholesterol Cal 22 5 - 40 mg/dL   LDL Calculated 63 0 - 99 mg/dL  TSH  Result Value Ref Range   TSH 1.410 0.450 - 4.500 uIU/mL      Assessment & Plan:   Problem List Items Addressed This Visit      Other   Healthy adult on routine physical examination - Primary    Healthy adult female.  Discussed all preventative  recommendations.  Labs today CBC, CMP, TSH, lipid panel.  Return in one year or sooner if acute issue arises.      Relevant Orders   Comprehensive metabolic panel   CBC with Differential   Lipid panel   TSH       Follow up plan: Return in about 1 year (around 06/27/2020) for Annual Physical.   LABORATORY TESTING:  - Pap smear: up to date  IMMUNIZATIONS:   - Tdap: Tetanus vaccination status reviewed: last tetanus booster within 10 years. - Influenza: Refused - Pneumovax: Not applicable - Prevnar: Not applicable - HPV: Not applicable - Zostavax vaccine: Not applicable  SCREENING: -Mammogram: Not applicable  - Colonoscopy: Not applicable  - Bone Density: Not applicable  -Hearing Test: Not applicable  -Spirometry: Not applicable   PATIENT COUNSELING:   Advised to take 1 mg of folate supplement per day if capable of pregnancy.   Sexuality: Discussed sexually transmitted diseases, partner selection, use of condoms, avoidance of unintended pregnancy  and contraceptive alternatives.   Advised to avoid cigarette smoking.  I discussed with the patient that most people either abstain from alcohol or drink within safe limits (<=14/week and <=4 drinks/occasion for males, <=7/weeks and <= 3 drinks/occasion for females) and that the risk for alcohol disorders and other health effects rises proportionally with the number of drinks per week and how often a drinker exceeds daily limits.  Discussed cessation/primary prevention of drug use and availability of treatment for abuse.   Diet: Encouraged to adjust caloric intake to maintain  or achieve ideal body weight, to reduce intake of dietary saturated fat and total fat, to limit sodium intake by avoiding high sodium foods and not adding table salt, and to maintain adequate dietary potassium and calcium preferably from fresh fruits, vegetables, and low-fat dairy products.    stressed the importance of regular exercise  Injury prevention:  Discussed safety belts, safety helmets, smoke detector, smoking near bedding or upholstery.   Dental health: Discussed importance of regular tooth brushing, flossing, and  dental visits.    NEXT PREVENTATIVE PHYSICAL DUE IN 1 YEAR. Return in about 1 year (around 06/27/2020) for Annual Physical.

## 2019-06-28 NOTE — Assessment & Plan Note (Signed)
Healthy adult female.  Discussed all preventative recommendations.  Labs today CBC, CMP, TSH, lipid panel.  Return in one year or sooner if acute issue arises.

## 2019-06-29 LAB — CBC WITH DIFFERENTIAL/PLATELET
Basophils Absolute: 0.1 10*3/uL (ref 0.0–0.2)
Basos: 1 %
EOS (ABSOLUTE): 0.1 10*3/uL (ref 0.0–0.4)
Eos: 2 %
Hematocrit: 41.9 % (ref 34.0–46.6)
Hemoglobin: 13.6 g/dL (ref 11.1–15.9)
Immature Grans (Abs): 0 10*3/uL (ref 0.0–0.1)
Immature Granulocytes: 0 %
Lymphocytes Absolute: 1.9 10*3/uL (ref 0.7–3.1)
Lymphs: 27 %
MCH: 28.9 pg (ref 26.6–33.0)
MCHC: 32.5 g/dL (ref 31.5–35.7)
MCV: 89 fL (ref 79–97)
Monocytes Absolute: 0.5 10*3/uL (ref 0.1–0.9)
Monocytes: 7 %
Neutrophils Absolute: 4.5 10*3/uL (ref 1.4–7.0)
Neutrophils: 63 %
Platelets: 247 10*3/uL (ref 150–450)
RBC: 4.71 x10E6/uL (ref 3.77–5.28)
RDW: 12.5 % (ref 11.7–15.4)
WBC: 7.2 10*3/uL (ref 3.4–10.8)

## 2019-06-29 LAB — COMPREHENSIVE METABOLIC PANEL
ALT: 12 IU/L (ref 0–32)
AST: 13 IU/L (ref 0–40)
Albumin/Globulin Ratio: 2.5 — ABNORMAL HIGH (ref 1.2–2.2)
Albumin: 5 g/dL — ABNORMAL HIGH (ref 3.8–4.8)
Alkaline Phosphatase: 62 IU/L (ref 39–117)
BUN/Creatinine Ratio: 13 (ref 9–23)
BUN: 11 mg/dL (ref 6–20)
Bilirubin Total: 2.5 mg/dL — ABNORMAL HIGH (ref 0.0–1.2)
CO2: 23 mmol/L (ref 20–29)
Calcium: 9.5 mg/dL (ref 8.7–10.2)
Chloride: 105 mmol/L (ref 96–106)
Creatinine, Ser: 0.85 mg/dL (ref 0.57–1.00)
GFR calc Af Amer: 104 mL/min/{1.73_m2} (ref >59)
GFR calc non Af Amer: 90 mL/min/{1.73_m2} (ref >59)
Globulin, Total: 2 g/dL (ref 1.5–4.5)
Glucose: 73 mg/dL (ref 65–99)
Potassium: 4.2 mmol/L (ref 3.5–5.2)
Sodium: 142 mmol/L (ref 134–144)
Total Protein: 7 g/dL (ref 6.0–8.5)

## 2019-06-29 LAB — LIPID PANEL
Chol/HDL Ratio: 2.8 ratio (ref 0.0–4.4)
Cholesterol, Total: 144 mg/dL (ref 100–199)
HDL: 51 mg/dL (ref >39)
LDL Chol Calc (NIH): 74 mg/dL (ref 0–99)
Triglycerides: 102 mg/dL (ref 0–149)
VLDL Cholesterol Cal: 19 mg/dL (ref 5–40)

## 2019-06-29 LAB — TSH: TSH: 1.02 u[IU]/mL (ref 0.450–4.500)

## 2019-08-07 ENCOUNTER — Encounter: Payer: Self-pay | Admitting: Emergency Medicine

## 2019-08-07 ENCOUNTER — Other Ambulatory Visit: Payer: Self-pay

## 2019-08-07 ENCOUNTER — Ambulatory Visit
Admission: EM | Admit: 2019-08-07 | Discharge: 2019-08-07 | Disposition: A | Discharge status: Home / Self Care | Payer: BC Managed Care – PPO | Attending: Emergency Medicine | Admitting: Emergency Medicine

## 2019-08-07 DIAGNOSIS — H6693 Otitis media, unspecified, bilateral: Secondary | ICD-10-CM | POA: Diagnosis not present

## 2019-08-07 DIAGNOSIS — Z0189 Encounter for other specified special examinations: Secondary | ICD-10-CM

## 2019-08-07 MED ORDER — BENZONATATE 100 MG PO CAPS: 100.0000 mg | ORAL_CAPSULE | Freq: Three times a day (TID) | ORAL | 0 refills | Status: DC | PRN

## 2019-08-07 MED ORDER — AMOXICILLIN 500 MG PO TABS: 500.0000 mg | ORAL_TABLET | Freq: Three times a day (TID) | ORAL | 0 refills | Status: DC

## 2019-08-07 NOTE — ED Provider Notes (Signed)
Roderic Palau    CSN: [id number] Arrival date & time: 08/07/19  1452      History   Chief Complaint Chief Complaint  Patient presents with  . Cough    HPI Laura Carey is a 33 y.o. female.   Patient presents with nasal congestion, ear pain, and nonproductive cough x1 week.  She has been treating this at home with Robitussin.  She denies fever, chills, sore throat, shortness of breath, vomiting, diarrhea, rash, or other symptoms.  The history is provided by the patient.    History reviewed. No pertinent past medical history.  Patient Active Problem List   Diagnosis Date Noted  . Healthy adult on routine physical examination 06/28/2019  . Urticaria 06/21/2017  . Septate uterus 12/07/2016  . Esophageal dysphagia 09/28/2016    Past Surgical History:  Procedure Laterality Date  . HYSTEROSCOPY  01/2017    OB History    Gravida  1   Para      Term      Preterm      AB      Living        SAB      TAB      Ectopic      Multiple      Live Births               Home Medications    Prior to Admission medications   Medication Sig Start Date End Date Taking? Authorizing Provider  amoxicillin (AMOXIL) 500 MG tablet Take 1 tablet (500 mg total) by mouth 3 (three) times daily. 08/07/19   Sharion Balloon, NP  benzonatate (TESSALON) 100 MG capsule Take 1 capsule (100 mg total) by mouth 3 (three) times daily as needed for cough. 08/07/19   Sharion Balloon, NP    Family History Family History  Problem Relation Age of Onset  . Cancer Maternal Grandmother        breast    Social History Social History   Tobacco Use  . Smoking status: Never Smoker  . Smokeless tobacco: Never Used  Substance Use Topics  . Alcohol use: No  . Drug use: No     Allergies   Patient has no known allergies.   Review of Systems Review of Systems  Constitutional: Negative for chills and fever.  HENT: Positive for congestion and ear pain. Negative for sore  throat.   Eyes: Negative for pain and visual disturbance.  Respiratory: Positive for cough. Negative for shortness of breath.   Cardiovascular: Negative for chest pain and palpitations.  Gastrointestinal: Negative for abdominal pain, diarrhea, nausea and vomiting.  Genitourinary: Negative for dysuria and hematuria.  Musculoskeletal: Negative for arthralgias and back pain.  Skin: Negative for color change and rash.  Neurological: Negative for seizures and syncope.  All other systems reviewed and are negative.    Physical Exam Triage Vital Signs ED Triage Vitals  Enc Vitals Group     BP      Pulse      Resp      Temp      Temp src      SpO2      Weight      Height      Head Circumference      Peak Flow      Pain Score      Pain Loc      Pain Edu?      Excl. in [place]?    No data  found.  Updated Vital Signs BP 115/81 (BP Location: Left Arm)   Pulse 76   Temp 99.4 F (37.4 C)   Resp 18   Wt 150 lb (68 kg)   LMP 07/29/2019   SpO2 99%   BMI 24.58 kg/m   Visual Acuity Right Eye Distance:   Left Eye Distance:   Bilateral Distance:    Right Eye Near:   Left Eye Near:    Bilateral Near:     Physical Exam Vitals and nursing note reviewed.  Constitutional:      General: She is not in acute distress.    Appearance: She is well-developed.  HENT:     Head: Normocephalic and atraumatic.     Right Ear: Ear canal normal. Tympanic membrane is erythematous.     Left Ear: Ear canal normal. Tympanic membrane is erythematous.     Nose: Congestion present.     Mouth/Throat:     Mouth: Mucous membranes are moist.     Pharynx: Oropharynx is clear.  Eyes:     Conjunctiva/sclera: Conjunctivae normal.  Cardiovascular:     Rate and Rhythm: Normal rate and regular rhythm.     Heart sounds: No murmur.  Pulmonary:     Effort: Pulmonary effort is normal. No respiratory distress.     Breath sounds: Normal breath sounds.  Abdominal:     General: Bowel sounds are normal.      Palpations: Abdomen is soft.     Tenderness: There is no abdominal tenderness. There is no guarding or rebound.  Musculoskeletal:     Cervical back: Neck supple.  Skin:    General: Skin is warm and dry.     Findings: No rash.  Neurological:     General: No focal deficit present.     Mental Status: She is alert and oriented to person, place, and time.  Psychiatric:        Mood and Affect: Mood normal.        Behavior: Behavior normal.      UC Treatments / Results  Labs (all labs ordered are listed, but only abnormal results are displayed) Labs Reviewed  NOVEL CORONAVIRUS, NAA    EKG   Radiology No results found.  Procedures Procedures (including critical care time)  Medications Ordered in UC Medications - No data to display  Initial Impression / Assessment and Plan / UC Course  I have reviewed the triage vital signs and the nursing notes.  Pertinent labs & imaging results that were available during my care of the patient were reviewed by me and considered in my medical decision making (see chart for details).     Bilateral otitis media.  Treating with amoxicillin and Tessalon Perles.  Instructed patient to follow-up with her PCP if her symptoms are not improving.  COVID test performed here.  Instructed patient to self quarantine until the test result is back.  Instructed patient to go to the emergency department if she develops high fever, shortness of breath, severe diarrhea, or other concerning symptoms.  Patient agrees with plan of care.     Final Clinical Impressions(s) / UC Diagnoses   Final diagnoses:  Bilateral otitis media, unspecified otitis media type  Patient request for diagnostic testing     Discharge Instructions     Take the amoxicillin as directed.  Take the [facility] as needed for your cough.  Follow-up with your primary care provider if your symptoms are not improving.    Your COVID test is  pending.  You should self quarantine until  your test result is back and is negative.    Go to the emergency department if you develop high fever, shortness of breath, severe diarrhea, or other concerning symptoms.         ED Prescriptions    Medication Sig Dispense Auth. Provider   amoxicillin (AMOXIL) 500 MG tablet Take 1 tablet (500 mg total) by mouth 3 (three) times daily. 21 tablet Mickie Bailate, Natahsa Marian H, NP   benzonatate (TESSALON) 100 MG capsule Take 1 capsule (100 mg total) by mouth 3 (three) times daily as needed for cough. 21 capsule Mickie Bailate, Konstantinos Cordoba H, NP     PDMP not reviewed this encounter.   Mickie Bailate, Shauntavia Brackin H, NP 08/07/19 [phone]

## 2019-08-07 NOTE — Discharge Instructions (Addendum)
Take the amoxicillin as directed.  Take the [facility] as needed for your cough.  Follow-up with your primary care provider if your symptoms are not improving.    Your COVID test is pending.  You should self quarantine until your test result is back and is negative.    Go to the emergency department if you develop high fever, shortness of breath, severe diarrhea, or other concerning symptoms.

## 2019-08-07 NOTE — ED Triage Notes (Signed)
Patient in office today c/o congestion,cough and stuffiness  Now patient stated that she has a persistent cough going on for 1wk  [id number]

## 2019-08-08 ENCOUNTER — Ambulatory Visit: Payer: BC Managed Care – PPO | Admitting: Family Medicine

## 2019-08-09 LAB — NOVEL CORONAVIRUS, NAA: SARS-CoV-2, NAA: NOT DETECTED

## 2019-08-28 DIAGNOSIS — M9902 Segmental and somatic dysfunction of thoracic region: Secondary | ICD-10-CM | POA: Diagnosis not present

## 2019-08-28 DIAGNOSIS — M542 Cervicalgia: Secondary | ICD-10-CM | POA: Diagnosis not present

## 2019-08-28 DIAGNOSIS — M9901 Segmental and somatic dysfunction of cervical region: Secondary | ICD-10-CM | POA: Diagnosis not present

## 2019-08-28 DIAGNOSIS — M4004 Postural kyphosis, thoracic region: Secondary | ICD-10-CM | POA: Diagnosis not present

## 2019-09-04 DIAGNOSIS — M542 Cervicalgia: Secondary | ICD-10-CM | POA: Diagnosis not present

## 2019-09-04 DIAGNOSIS — M9901 Segmental and somatic dysfunction of cervical region: Secondary | ICD-10-CM | POA: Diagnosis not present

## 2019-09-04 DIAGNOSIS — M4004 Postural kyphosis, thoracic region: Secondary | ICD-10-CM | POA: Diagnosis not present

## 2019-09-04 DIAGNOSIS — M9902 Segmental and somatic dysfunction of thoracic region: Secondary | ICD-10-CM | POA: Diagnosis not present

## 2019-09-16 ENCOUNTER — Other Ambulatory Visit: Payer: Self-pay

## 2019-09-16 ENCOUNTER — Encounter: Payer: Self-pay | Admitting: Family Medicine

## 2019-09-16 ENCOUNTER — Ambulatory Visit (INDEPENDENT_AMBULATORY_CARE_PROVIDER_SITE_OTHER): Payer: BC Managed Care – PPO | Admitting: Family Medicine

## 2019-09-16 VITALS — Ht 66.0 in | Wt 155.0 lb

## 2019-09-16 DIAGNOSIS — J069 Acute upper respiratory infection, unspecified: Secondary | ICD-10-CM | POA: Diagnosis not present

## 2019-09-16 MED ORDER — AMOXICILLIN-POT CLAVULANATE 875-125 MG PO TABS: 1.0000 | ORAL_TABLET | Freq: Two times a day (BID) | ORAL | 0 refills | Status: DC

## 2019-09-16 MED ORDER — FLUTICASONE PROPIONATE 50 MCG/ACT NA SUSP: 2.0000 | Freq: Two times a day (BID) | NASAL | 6 refills | Status: DC

## 2019-09-16 NOTE — Progress Notes (Signed)
Ht 5\' 6"  (1.676 m)   Wt 155 lb (70.3 kg)   BMI 25.02 kg/m    Subjective:    Patient ID: Laura Carey, female    DOB: 10-06-1985, 34 y.o.   MRN: [id number]  HPI: Laura Carey is a 34 y.o. female  Chief Complaint  Patient presents with  . Ear Pain    left ear x 3 days  . Nasal Congestion    x a week    . This visit was completed via WebEx due to the restrictions of the COVID-19 pandemic. All issues as above were discussed and addressed. Physical exam was done as above through visual confirmation on WebEx. If it was felt that the patient should be evaluated in the office, they were directed there. The patient verbally consented to this visit. . Location of the patient: home . Location of the provider: work . Those involved with this call:  . Provider: Merrie Roof, PA-C . CMA: Lesle Chris, [place] . Front Desk/Registration: Jill Side  . Time spent on call: 15 minutes with patient face to face via video conference. More than 50% of this time was spent in counseling and coordination of care. 5 minutes total spent in review of patient's record and preparation of their chart. I verified patient identity using two factors (patient name and date of birth). Patient consents verbally to being seen via telemedicine visit today.   Sinus and nasal congestion, sinus pain and pressure, left ear pain and pressure for about a week now. Trying OTC cold and sinus medications and mucinex. Had same sxs nearly a month ago and was dx'd at [facility] with ear infection. Those sxs cleared up with abx at that time. Denies fevers, chills, vomiting, diarrhea, body aches. Daughter also sick, tested neg for COVID 3 days ago.   Relevant past medical, surgical, family and social history reviewed and updated as indicated. Interim medical history since our last visit reviewed. Allergies and medications reviewed and updated.  Review of Systems  Per HPI unless specifically indicated above     Objective:    Ht 5'  6" (1.676 m)   Wt 155 lb (70.3 kg)   BMI 25.02 kg/m   Wt Readings from Last 3 Encounters:  09/16/19 155 lb (70.3 kg)  08/07/19 150 lb (68 kg)  06/28/19 153 lb (69.4 kg)    Physical Exam Vitals and nursing note reviewed.  Constitutional:      General: She is not in acute distress.    Appearance: Normal appearance.  HENT:     Head: Atraumatic.     Right Ear: External ear normal.     Left Ear: External ear normal.     Nose: Congestion present.     Mouth/Throat:     Mouth: Mucous membranes are moist.     Pharynx: Oropharynx is clear. Posterior oropharyngeal erythema present.  Eyes:     Extraocular Movements: Extraocular movements intact.     Conjunctiva/sclera: Conjunctivae normal.  Cardiovascular:     Comments: Unable to assess via virtual visit Pulmonary:     Effort: Pulmonary effort is normal. No respiratory distress.  Musculoskeletal:        General: Normal range of motion.     Cervical back: Normal range of motion.  Skin:    General: Skin is dry.     Findings: No erythema.  Neurological:     Mental Status: She is alert and oriented to person, place, and time.  Psychiatric:  Mood and Affect: Mood normal.        Thought Content: Thought content normal.        Judgment: Judgment normal.     Results for orders placed or performed during the hospital encounter of 08/07/19  Novel Coronavirus, NAA ([facility])   Specimen: Nasopharyngeal(NP) swabs in vial transport medium   NASOPHARYNGE  Result Value Ref Range   SARS-CoV-2, NAA Not Detected Not Detected      Assessment & Plan:   Problem List Items Addressed This Visit    None    Visit Diagnoses    Upper respiratory tract infection, unspecified type    -  Primary   Tx with augmentin, flonase, mucinex, sinus rinses. May need to start regular antihistamine routine if sxs recurring. F/u if worsening or not resolving       Follow up plan: Return if symptoms worsen or fail to improve.

## 2020-01-12 DIAGNOSIS — R0981 Nasal congestion: Secondary | ICD-10-CM | POA: Diagnosis not present

## 2020-01-12 DIAGNOSIS — H6982 Other specified disorders of Eustachian tube, left ear: Secondary | ICD-10-CM | POA: Diagnosis not present

## 2020-01-17 ENCOUNTER — Other Ambulatory Visit: Payer: Self-pay

## 2020-01-17 ENCOUNTER — Ambulatory Visit
Admission: EM | Admit: 2020-01-17 | Discharge: 2020-01-17 | Disposition: A | Discharge status: Home / Self Care | Payer: BC Managed Care – PPO | Attending: Emergency Medicine | Admitting: Emergency Medicine

## 2020-01-17 DIAGNOSIS — R05 Cough: Secondary | ICD-10-CM | POA: Insufficient documentation

## 2020-01-17 DIAGNOSIS — J069 Acute upper respiratory infection, unspecified: Secondary | ICD-10-CM | POA: Insufficient documentation

## 2020-01-17 DIAGNOSIS — R059 Cough, unspecified: Secondary | ICD-10-CM

## 2020-01-17 LAB — POCT RAPID STREP A (OFFICE): Rapid Strep A Screen: NEGATIVE

## 2020-01-17 MED ORDER — BENZONATATE 100 MG PO CAPS
100.0000 mg | ORAL_CAPSULE | Freq: Three times a day (TID) | ORAL | 0 refills | Status: DC | PRN
Start: 2020-01-17 — End: 2020-05-08

## 2020-01-17 NOTE — ED Provider Notes (Signed)
Renaldo Fiddler    CSN: [id number] Arrival date & time: 01/17/20  1660      History   Chief Complaint Chief Complaint  Patient presents with  . Cough    HPI Laura Carey is a 34 y.o. female.   Patient presents with a nonproductive cough, congestion, runny nose, post nasal drip, and pain in her ears when swallowing.  She denies fever, chills, rash, shortness of breath, vomiting, diarrhea, or other symptoms.  Treatment attempted at home with Tylenol.  The history is provided by the patient.    No past medical history on file.  Patient Active Problem List   Diagnosis Date Noted  . Healthy adult on routine physical examination 06/28/2019  . Urticaria 06/21/2017  . Septate uterus 12/07/2016  . Esophageal dysphagia 09/28/2016    Past Surgical History:  Procedure Laterality Date  . HYSTEROSCOPY  01/2017    OB History    Gravida  1   Para      Term      Preterm      AB      Living        SAB      TAB      Ectopic      Multiple      Live Births               Home Medications    Prior to Admission medications   Medication Sig Start Date End Date Taking? Authorizing Provider  benzonatate (TESSALON) 100 MG capsule Take 1 capsule (100 mg total) by mouth 3 (three) times daily as needed for cough. 01/17/20   Mickie Bail, NP    Family History Family History  Problem Relation Age of Onset  . Cancer Maternal Grandmother        breast    Social History Social History   Tobacco Use  . Smoking status: Never Smoker  . Smokeless tobacco: Never Used  Substance Use Topics  . Alcohol use: No  . Drug use: No     Allergies   Patient has no known allergies.   Review of Systems Review of Systems  Constitutional: Negative for chills and fever.  HENT: Positive for congestion, ear pain, postnasal drip and rhinorrhea. Negative for sore throat.   Eyes: Negative for pain and visual disturbance.  Respiratory: Positive for cough. Negative for  shortness of breath.   Cardiovascular: Negative for chest pain and palpitations.  Gastrointestinal: Negative for abdominal pain and vomiting.  Genitourinary: Negative for dysuria and hematuria.  Musculoskeletal: Negative for arthralgias and back pain.  Skin: Negative for color change and rash.  Neurological: Negative for seizures and syncope.  All other systems reviewed and are negative.    Physical Exam Triage Vital Signs ED Triage Vitals [01/17/20 0834]  Enc Vitals Group     BP 111/75     Pulse Rate 74     Resp 16     Temp 97.9 F (36.6 C)     Temp src      SpO2 98 %     Weight      Height      Head Circumference      Peak Flow      Pain Score      Pain Loc      Pain Edu?      Excl. in GC?    No data found.  Updated Vital Signs BP 111/75   Pulse 74   Temp 97.9 F (  36.6 C)   Resp 16   LMP 01/12/2020   SpO2 98%   Visual Acuity Right Eye Distance:   Left Eye Distance:   Bilateral Distance:    Right Eye Near:   Left Eye Near:    Bilateral Near:     Physical Exam Vitals and nursing note reviewed.  Constitutional:      General: She is not in acute distress.    Appearance: She is well-developed. She is not ill-appearing.  HENT:     Head: Normocephalic and atraumatic.     Right Ear: Tympanic membrane and ear canal normal.     Left Ear: Tympanic membrane and ear canal normal.     Nose: Congestion and rhinorrhea present.     Mouth/Throat:     Mouth: Mucous membranes are moist.     Pharynx: Oropharynx is clear.  Eyes:     Conjunctiva/sclera: Conjunctivae normal.  Cardiovascular:     Rate and Rhythm: Normal rate and regular rhythm.     Heart sounds: No murmur.  Pulmonary:     Effort: Pulmonary effort is normal. No respiratory distress.     Breath sounds: Normal breath sounds. No wheezing or rhonchi.  Abdominal:     Palpations: Abdomen is soft.     Tenderness: There is no abdominal tenderness. There is no guarding or rebound.  Musculoskeletal:      Cervical back: Neck supple.  Skin:    General: Skin is warm and dry.     Findings: No rash.  Neurological:     General: No focal deficit present.     Mental Status: She is alert and oriented to person, place, and time.     Gait: Gait normal.  Psychiatric:        Mood and Affect: Mood normal.        Behavior: Behavior normal.      UC Treatments / Results  Labs (all labs ordered are listed, but only abnormal results are displayed) Labs Reviewed  NOVEL CORONAVIRUS, NAA  CULTURE, GROUP A STREP [facility])  POCT RAPID STREP A (OFFICE)    EKG   Radiology No results found.  Procedures Procedures (including critical care time)  Medications Ordered in UC Medications - No data to display  Initial Impression / Assessment and Plan / UC Course  I have reviewed the triage vital signs and the nursing notes.  Pertinent labs & imaging results that were available during my care of the patient were reviewed by me and considered in my medical decision making (see chart for details).   URI, cough.  Treating cough with Tessalon Perles.  Rapid strep negative; throat culture pending.  PCR COVID test performed here.  Instructed patient to self quarantine until the test result is back.  Discussed with patient that she can take Tylenol as needed for fever or discomfort.  Instructed patient to go to the emergency department if she develops high fever, shortness of breath, severe diarrhea, or other concerning symptoms.  Patient agrees with plan of care.      Final Clinical Impressions(s) / UC Diagnoses   Final diagnoses:  Cough  Upper respiratory tract infection, unspecified type     Discharge Instructions     Take the Tessalon Perles as needed for your cough.    Your rapid strep test is negative.  A throat culture is pending; we will call you if it is positive requiring treatment.    Your COVID test is pending.  You should self quarantine until  the test result is back.    Take Tylenol as  needed for fever or discomfort.  Rest and keep yourself hydrated.    Go to the emergency department if you develop shortness of breath, severe diarrhea, high fever not relieved by Tylenol or ibuprofen, or other concerning symptoms.       ED Prescriptions    Medication Sig Dispense Auth. Provider   benzonatate (TESSALON) 100 MG capsule Take 1 capsule (100 mg total) by mouth 3 (three) times daily as needed for cough. 21 capsule Mickie Bail, NP     PDMP not reviewed this encounter.   Mickie Bail, NP 01/17/20 [phone]

## 2020-01-17 NOTE — ED Triage Notes (Signed)
Pt c/o cough and "pain when I swallow that goes into my ears" x 1 week. No fevers.

## 2020-01-17 NOTE — Discharge Instructions (Signed)
Take the [facility] as needed for your cough.    Your rapid strep test is negative.  A throat culture is pending; we will call you if it is positive requiring treatment.    Your COVID test is pending.  You should self quarantine until the test result is back.    Take Tylenol as needed for fever or discomfort.  Rest and keep yourself hydrated.    Go to the emergency department if you develop shortness of breath, severe diarrhea, high fever not relieved by Tylenol or ibuprofen, or other concerning symptoms.

## 2020-01-18 LAB — SARS-COV-2, NAA 2 DAY TAT

## 2020-01-18 LAB — NOVEL CORONAVIRUS, NAA: SARS-CoV-2, NAA: NOT DETECTED

## 2020-01-19 LAB — CULTURE, GROUP A STREP (THRC)

## 2020-03-16 DIAGNOSIS — M9901 Segmental and somatic dysfunction of cervical region: Secondary | ICD-10-CM | POA: Diagnosis not present

## 2020-03-16 DIAGNOSIS — M5413 Radiculopathy, cervicothoracic region: Secondary | ICD-10-CM | POA: Diagnosis not present

## 2020-03-23 DIAGNOSIS — M9901 Segmental and somatic dysfunction of cervical region: Secondary | ICD-10-CM | POA: Diagnosis not present

## 2020-03-23 DIAGNOSIS — M5413 Radiculopathy, cervicothoracic region: Secondary | ICD-10-CM | POA: Diagnosis not present

## 2020-03-27 ENCOUNTER — Ambulatory Visit: Payer: BC Managed Care – PPO | Admitting: Adult Health

## 2020-04-17 ENCOUNTER — Ambulatory Visit: Payer: BC Managed Care – PPO | Admitting: Adult Health

## 2020-04-29 ENCOUNTER — Ambulatory Visit: Payer: Self-pay | Admitting: Adult Health

## 2020-05-08 ENCOUNTER — Encounter: Payer: Self-pay | Admitting: Adult Health

## 2020-05-08 ENCOUNTER — Ambulatory Visit (INDEPENDENT_AMBULATORY_CARE_PROVIDER_SITE_OTHER): Payer: BC Managed Care – PPO | Admitting: Adult Health

## 2020-05-08 ENCOUNTER — Other Ambulatory Visit: Payer: Self-pay

## 2020-05-08 VITALS — BP 103/70 | HR 60 | Temp 98.1°F | Resp 16 | Ht 66.0 in | Wt 157.0 lb

## 2020-05-08 DIAGNOSIS — R5383 Other fatigue: Secondary | ICD-10-CM

## 2020-05-08 DIAGNOSIS — Z8709 Personal history of other diseases of the respiratory system: Secondary | ICD-10-CM

## 2020-05-08 DIAGNOSIS — Z1321 Encounter for screening for nutritional disorder: Secondary | ICD-10-CM

## 2020-05-08 NOTE — Progress Notes (Addendum)
New patient visit   Patient: Laura Carey   DOB: 04/14/1986   34 y.o. Female  MRN: [id number] Visit Date: 05/08/2020  Today's healthcare provider: Marcille Buffy, FNP   Chief Complaint  Patient presents with  . New Patient (Initial Visit)   Subjective    Laura Carey is a 34 y.o. female who presents today as a new patient to establish care.  HPI  Patient reports she is very prone to catching upper respiratory infections. She reports mostly URI history with child and then she gets it. Daughter is 62 years olds and she gets sick often. She reports her and her husband catch it most of the time. She reports daughter is in daycare.   Last URI was 01/17/2020 she was seen in ED>   Patient has history of sinus infections and ear infections.    November 2021 physical was last.   Fatigue since having child.   LMP.  September 1st 2021.   Seen last OBGYN 2019. Denies any abnormal pap smear. Vaginal birth. 2019 Abdominal hysterectomy.  She will let me know in [place].  No birth control She is working remote.   Grandmother maternal breast cancer age 75's - mother without any cancer. Discussed mammograms at age 51 and self breast exams. Denies any concerns.   Patient  denies any fever, body aches,chills, rash, chest pain, shortness of breath, nausea, vomiting, or diarrhea.    History reviewed. No pertinent past medical history. Past Surgical History:  Procedure Laterality Date  . ABDOMINAL HYSTERECTOMY    . HYSTEROSCOPY  01/2017   Family Status  Relation Name Status  . Mother  Alive  . Father  Alive  . MGM  Deceased  . MGF  Deceased  . PGM  Deceased  . PGF  Deceased   Family History  Problem Relation Age of Onset  . Cancer Maternal Grandmother        breast   Social History   Socioeconomic History  . Marital status: Married    Spouse name: Not on file  . Number of children: Not on file  . Years of education: Not on file  . Highest education level: Not  on file  Occupational History  . Not on file  Tobacco Use  . Smoking status: Never Smoker  . Smokeless tobacco: Never Used  Vaping Use  . Vaping Use: Never used  Substance and Sexual Activity  . Alcohol use: Yes  . Drug use: No  . Sexual activity: Not Currently  Other Topics Concern  . Not on file  Social History Narrative  . Not on file   Social Determinants of Health   Financial Resource Strain:   . Difficulty of Paying Living Expenses: Not on file  Food Insecurity:   . Worried About Charity fundraiser in the Last Year: Not on file  . Ran Out of Food in the Last Year: Not on file  Transportation Needs:   . Lack of Transportation (Medical): Not on file  . Lack of Transportation (Non-Medical): Not on file  Physical Activity:   . Days of Exercise per Week: Not on file  . Minutes of Exercise per Session: Not on file  Stress:   . Feeling of Stress : Not on file  Social Connections:   . Frequency of Communication with Friends and Family: Not on file  . Frequency of Social Gatherings with Friends and Family: Not on file  . Attends Religious Services: Not on file  .  Active Member of Clubs or Organizations: Not on file  . Attends Archivist Meetings: Not on file  . Marital Status: Not on file   Outpatient Medications Prior to Visit  Medication Sig  . [DISCONTINUED] benzonatate (TESSALON) 100 MG capsule Take 1 capsule (100 mg total) by mouth 3 (three) times daily as needed for cough.   No facility-administered medications prior to visit.   No Known Allergies  Immunization History  Administered Date(s) Administered  . MMR 08/12/2013  . Tdap 01/05/2018    Health Maintenance  Topic Date Due  . Hepatitis C Screening  Never done  . COVID-19 Vaccine (1) Never done  . PAP SMEAR-Modifier  06/09/2019  . INFLUENZA VACCINE  11/12/2020 (Originally 03/15/2020)  . TETANUS/TDAP  01/06/2028  . HIV Screening  Completed    Patient Care Team: Jamie Hafford, Kelby Aline, FNP  as PCP - General (Family Medicine)  Review of Systems  Constitutional: Positive for fatigue.  HENT: Positive for congestion and sinus pressure.   Gastrointestinal: Positive for abdominal pain.  Musculoskeletal: Positive for neck pain and neck stiffness.  Neurological: Positive for headaches.  All other systems reviewed and are negative.     Objective    BP 103/70   Pulse 60   Temp 98.1 F (36.7 C) (Oral)   Resp 16   Ht '5\' 6"'  (1.676 m)   Wt 157 lb (71.2 kg)   LMP 04/15/2020 (Exact Date)   SpO2 99%   Breastfeeding No   BMI 25.34 kg/m  Physical Exam Vitals reviewed.  Constitutional:      General: She is not in acute distress.    Appearance: She is well-developed. She is not ill-appearing, toxic-appearing or diaphoretic.     Interventions: She is not intubated. HENT:     Head: Normocephalic and atraumatic.     Right Ear: Tympanic membrane, ear canal and external ear normal. There is no impacted cerumen.     Left Ear: Tympanic membrane, ear canal and external ear normal. There is no impacted cerumen.     Nose: Nose normal. No congestion or rhinorrhea.     Mouth/Throat:     Mouth: Mucous membranes are moist.     Pharynx: No oropharyngeal exudate or posterior oropharyngeal erythema.  Eyes:     General: Lids are normal. No scleral icterus.       Right eye: No discharge.        Left eye: No discharge.     Extraocular Movements: Extraocular movements intact.     Conjunctiva/sclera: Conjunctivae normal.     Right eye: Right conjunctiva is not injected. No exudate or hemorrhage.    Left eye: Left conjunctiva is not injected. No exudate or hemorrhage.    Pupils: Pupils are equal, round, and reactive to light.  Neck:     Thyroid: No thyroid mass or thyromegaly.     Vascular: Normal carotid pulses. No carotid bruit, hepatojugular reflux or JVD.     Trachea: Trachea and phonation normal. No tracheal tenderness or tracheal deviation.     Meningeal: Brudzinski's sign and Kernig's  sign absent.  Cardiovascular:     Rate and Rhythm: Normal rate and regular rhythm.     Pulses: Normal pulses.          Radial pulses are 2+ on the right side and 2+ on the left side.       Dorsalis pedis pulses are 2+ on the right side and 2+ on the left side.  Posterior tibial pulses are 2+ on the right side and 2+ on the left side.     Heart sounds: Normal heart sounds, S1 normal and S2 normal. Heart sounds not distant. No murmur heard.  No friction rub. No gallop.   Pulmonary:     Effort: Pulmonary effort is normal. No tachypnea, bradypnea, accessory muscle usage or respiratory distress. She is not intubated.     Breath sounds: Normal breath sounds. No stridor. No wheezing or rales.  Chest:     Chest wall: No tenderness.  Abdominal:     General: Bowel sounds are normal. There is no distension or abdominal bruit.     Palpations: Abdomen is soft. There is no shifting dullness, fluid wave, hepatomegaly, splenomegaly, mass or pulsatile mass.     Tenderness: There is no abdominal tenderness. There is no right CVA tenderness, left CVA tenderness, guarding or rebound.     Hernia: No hernia is present.  Musculoskeletal:        General: No swelling, tenderness, deformity or signs of injury. Normal range of motion.     Cervical back: Full passive range of motion without pain, normal range of motion and neck supple. No edema, erythema, rigidity or tenderness. No spinous process tenderness or muscular tenderness. Normal range of motion.     Right lower leg: No edema.     Left lower leg: No edema.  Lymphadenopathy:     Head:     Right side of head: No submental, submandibular, tonsillar, preauricular, posterior auricular or occipital adenopathy.     Left side of head: No submental, submandibular, tonsillar, preauricular, posterior auricular or occipital adenopathy.     Cervical: No cervical adenopathy.     Right cervical: No superficial, deep or posterior cervical adenopathy.    Left  cervical: No superficial, deep or posterior cervical adenopathy.     Upper Body:     Right upper body: No supraclavicular or pectoral adenopathy.     Left upper body: No supraclavicular or pectoral adenopathy.  Skin:    General: Skin is warm and dry.     Capillary Refill: Capillary refill takes less than 2 seconds.     Coloration: Skin is not jaundiced or pale.     Findings: No abrasion, bruising, burn, ecchymosis, erythema, lesion, petechiae or rash.     Nails: There is no clubbing.  Neurological:     General: No focal deficit present.     Mental Status: She is alert and oriented to person, place, and time.     GCS: GCS eye subscore is 4. GCS verbal subscore is 5. GCS motor subscore is 6.     Cranial Nerves: No cranial nerve deficit.     Sensory: No sensory deficit.     Motor: No weakness, tremor, atrophy, abnormal muscle tone or seizure activity.     Coordination: Coordination normal.     Gait: Gait normal.     Deep Tendon Reflexes: Reflexes are normal and symmetric. Reflexes normal. Babinski sign absent on the right side. Babinski sign absent on the left side.     Reflex Scores:      Tricep reflexes are 2+ on the right side and 2+ on the left side.      Bicep reflexes are 2+ on the right side and 2+ on the left side.      Brachioradialis reflexes are 2+ on the right side and 2+ on the left side.      Patellar reflexes are 2+ on  the right side and 2+ on the left side.      Achilles reflexes are 2+ on the right side and 2+ on the left side. Psychiatric:        Mood and Affect: Mood normal.        Speech: Speech normal.        Behavior: Behavior normal.        Thought Content: Thought content normal.        Judgment: Judgment normal.     Depression Screen PHQ 2/9 Scores 05/08/2020 06/28/2019 06/22/2018 06/21/2017  PHQ - 2 Score 1 0 3 0  PHQ- 9 Score '2 2 3 ' 0   No results found for any visits on 05/08/20.  Assessment & Plan      Fatigue, unspecified type - Plan: Lipid Panel w/o  Chol/HDL Ratio, TSH, CBC with Differential/Platelet, Comprehensive Metabolic Panel (CMET), VITAMIN D 25 Hydroxy (Vit-D Deficiency, Fractures)  Encounter for vitamin deficiency screening - Plan: VITAMIN D 25 Hydroxy (Vit-D Deficiency, Fractures)  History of sinusitis   No orders of the defined types were placed in this encounter.    Orders Placed This Encounter  Procedures  . Lipid Panel w/o Chol/HDL Ratio  . TSH  . CBC with Differential/Platelet  . Comprehensive Metabolic Panel (CMET)  . VITAMIN D 25 Hydroxy (Vit-D Deficiency, Fractures)    Return in about 2 months (around 07/08/2020), or if symptoms worsen or fail to improve, for at any time for any worsening symptoms, Go to Emergency room/ urgent care if worse.    Addressed acute and or chronic medical problems today requiring 40 minutes reviewing patients medical record,labs, counseling patient regarding patient's conditions, any medications, answering questions regarding health, and coordination of care as needed. After visit summary patient given copy and reviewed.   Red Flags discussed. The patient was given clear instructions to go to ER or return to medical center if any red flags develop, symptoms do not improve, worsen or new problems develop. They verbalized understanding.  Marcille Buffy, [place] [phone] (phone) [phone] (fax)  [place]

## 2020-05-08 NOTE — Patient Instructions (Addendum)
Orders Placed This Encounter  Procedures  . Lipid Panel w/o Chol/HDL Ratio  . TSH  . CBC with Differential/Platelet  . Comprehensive Metabolic Panel (CMET)  . VITAMIN D 25 Hydroxy (Vit-D Deficiency, Fractures)     Health Maintenance, Female Adopting a healthy lifestyle and getting preventive care are important in promoting health and wellness. Ask your health care provider about:  The right schedule for you to have regular tests and exams.  Things you can do on your own to prevent diseases and keep yourself healthy. What should I know about diet, weight, and exercise? Eat a healthy diet   Eat a diet that includes plenty of vegetables, fruits, low-fat dairy products, and lean protein.  Do not eat a lot of foods that are high in solid fats, added sugars, or sodium. Maintain a healthy weight Body mass index (BMI) is used to identify weight problems. It estimates body fat based on height and weight. Your health care provider can help determine your BMI and help you achieve or maintain a healthy weight. Get regular exercise Get regular exercise. This is one of the most important things you can do for your health. Most adults should:  Exercise for at least 150 minutes each week. The exercise should increase your heart rate and make you sweat (moderate-intensity exercise).  Do strengthening exercises at least twice a week. This is in addition to the moderate-intensity exercise.  Spend less time sitting. Even light physical activity can be beneficial. Watch cholesterol and blood lipids Have your blood tested for lipids and cholesterol at 34 years of age, then have this test every 5 years. Have your cholesterol levels checked more often if:  Your lipid or cholesterol levels are high.  You are older than 34 years of age.  You are at high risk for heart disease. What should I know about cancer screening? Depending on your health history and family history, you may need to have cancer  screening at various ages. This may include screening for:  Breast cancer.  Cervical cancer.  Colorectal cancer.  Skin cancer.  Lung cancer. What should I know about heart disease, diabetes, and high blood pressure? Blood pressure and heart disease  High blood pressure causes heart disease and increases the risk of stroke. This is more likely to develop in people who have high blood pressure readings, are of African descent, or are overweight.  Have your blood pressure checked: ? Every 3-5 years if you are 63-52 years of age. ? Every year if you are 73 years old or older. Diabetes Have regular diabetes screenings. This checks your fasting blood sugar level. Have the screening done:  Once every three years after age 77 if you are at a normal weight and have a low risk for diabetes.  More often and at a younger age if you are overweight or have a high risk for diabetes. What should I know about preventing infection? Hepatitis B If you have a higher risk for hepatitis B, you should be screened for this virus. Talk with your health care provider to find out if you are at risk for hepatitis B infection. Hepatitis C Testing is recommended for:  Everyone born from [birth year] through 1965.  Anyone with known risk factors for hepatitis C. Sexually transmitted infections (STIs)  Get screened for STIs, including gonorrhea and chlamydia, if: ? You are sexually active and are younger than 34 years of age. ? You are older than 34 years of age and your health care  provider tells you that you are at risk for this type of infection. ? Your sexual activity has changed since you were last screened, and you are at increased risk for chlamydia or gonorrhea. Ask your health care provider if you are at risk.  Ask your health care provider about whether you are at high risk for HIV. Your health care provider may recommend a prescription medicine to help prevent HIV infection. If you choose to take  medicine to prevent HIV, you should first get tested for HIV. You should then be tested every 3 months for as long as you are taking the medicine. Pregnancy  If you are about to stop having your period (premenopausal) and you may become pregnant, seek counseling before you get pregnant.  Take 400 to 800 micrograms (mcg) of folic acid every day if you become pregnant.  Ask for birth control (contraception) if you want to prevent pregnancy. Osteoporosis and menopause Osteoporosis is a disease in which the bones lose minerals and strength with aging. This can result in bone fractures. If you are 12 years old or older, or if you are at risk for osteoporosis and fractures, ask your health care provider if you should:  Be screened for bone loss.  Take a calcium or vitamin D supplement to lower your risk of fractures.  Be given hormone replacement therapy (HRT) to treat symptoms of menopause. Follow these instructions at home: Lifestyle  Do not use any products that contain nicotine or tobacco, such as cigarettes, e-cigarettes, and chewing tobacco. If you need help quitting, ask your health care provider.  Do not use street drugs.  Do not share needles.  Ask your health care provider for help if you need support or information about quitting drugs. Alcohol use  Do not drink alcohol if: ? Your health care provider tells you not to drink. ? You are pregnant, may be pregnant, or are planning to become pregnant.  If you drink alcohol: ? Limit how much you use to 0-1 drink a day. ? Limit intake if you are breastfeeding.  Be aware of how much alcohol is in your drink. In the [place] one drink equals one 12 oz bottle of beer (355 mL), one 5 oz glass of wine (148 mL), or one 1 oz glass of hard liquor (44 mL). General instructions  Schedule regular health, dental, and eye exams.  Stay current with your vaccines.  Tell your health care provider if: ? You often feel depressed. ? You have  ever been abused or do not feel safe at home. Summary  Adopting a healthy lifestyle and getting preventive care are important in promoting health and wellness.  Follow your health care provider's instructions about healthy diet, exercising, and getting tested or screened for diseases.  Follow your health care provider's instructions on monitoring your cholesterol and blood pressure. This information is not intended to replace advice given to you by your health care provider. Make sure you discuss any questions you have with your health care provider. Document Revised: 07/25/2018 Document Reviewed: 07/25/2018 Elsevier Patient Education  2020 [employer].

## 2020-05-10 DIAGNOSIS — R5383 Other fatigue: Secondary | ICD-10-CM | POA: Insufficient documentation

## 2020-05-10 DIAGNOSIS — Z1321 Encounter for screening for nutritional disorder: Secondary | ICD-10-CM | POA: Insufficient documentation

## 2020-05-10 DIAGNOSIS — Z8709 Personal history of other diseases of the respiratory system: Secondary | ICD-10-CM | POA: Insufficient documentation

## 2020-05-15 DIAGNOSIS — N923 Ovulation bleeding: Secondary | ICD-10-CM | POA: Insufficient documentation

## 2020-05-15 DIAGNOSIS — R079 Chest pain, unspecified: Secondary | ICD-10-CM | POA: Insufficient documentation

## 2020-05-19 ENCOUNTER — Encounter: Payer: Self-pay | Admitting: Adult Health

## 2020-05-19 DIAGNOSIS — R5383 Other fatigue: Secondary | ICD-10-CM | POA: Diagnosis not present

## 2020-05-19 DIAGNOSIS — Z1321 Encounter for screening for nutritional disorder: Secondary | ICD-10-CM | POA: Diagnosis not present

## 2020-05-20 LAB — COMPREHENSIVE METABOLIC PANEL
ALT: 9 IU/L (ref 0–32)
AST: 13 IU/L (ref 0–40)
Albumin/Globulin Ratio: 1.7 (ref 1.2–2.2)
Albumin: 4.7 g/dL (ref 3.8–4.8)
Alkaline Phosphatase: 62 IU/L (ref 44–121)
BUN/Creatinine Ratio: 9 (ref 9–23)
BUN: 8 mg/dL (ref 6–20)
Bilirubin Total: 1.9 mg/dL — ABNORMAL HIGH (ref 0.0–1.2)
CO2: 23 mmol/L (ref 20–29)
Calcium: 9.9 mg/dL (ref 8.7–10.2)
Chloride: 100 mmol/L (ref 96–106)
Creatinine, Ser: 0.92 mg/dL (ref 0.57–1.00)
GFR calc Af Amer: 94 mL/min/{1.73_m2} (ref >59)
GFR calc non Af Amer: 81 mL/min/{1.73_m2} (ref >59)
Globulin, Total: 2.7 g/dL (ref 1.5–4.5)
Glucose: 92 mg/dL (ref 65–99)
Potassium: 4.1 mmol/L (ref 3.5–5.2)
Sodium: 138 mmol/L (ref 134–144)
Total Protein: 7.4 g/dL (ref 6.0–8.5)

## 2020-05-20 LAB — CBC WITH DIFFERENTIAL/PLATELET
Basophils Absolute: 0 10*3/uL (ref 0.0–0.2)
Basos: 0 %
EOS (ABSOLUTE): 0.2 10*3/uL (ref 0.0–0.4)
Eos: 2 %
Hematocrit: 37.5 % (ref 34.0–46.6)
Hemoglobin: 12.3 g/dL (ref 11.1–15.9)
Immature Grans (Abs): 0 10*3/uL (ref 0.0–0.1)
Immature Granulocytes: 0 %
Lymphocytes Absolute: 1.4 10*3/uL (ref 0.7–3.1)
Lymphs: 21 %
MCH: 28.5 pg (ref 26.6–33.0)
MCHC: 32.8 g/dL (ref 31.5–35.7)
MCV: 87 fL (ref 79–97)
Monocytes Absolute: 0.4 10*3/uL (ref 0.1–0.9)
Monocytes: 6 %
Neutrophils Absolute: 4.7 10*3/uL (ref 1.4–7.0)
Neutrophils: 71 %
Platelets: 242 10*3/uL (ref 150–450)
RBC: 4.32 x10E6/uL (ref 3.77–5.28)
RDW: 13.2 % (ref 11.7–15.4)
WBC: 6.7 10*3/uL (ref 3.4–10.8)

## 2020-05-20 LAB — VITAMIN D 25 HYDROXY (VIT D DEFICIENCY, FRACTURES): Vit D, 25-Hydroxy: 34.8 ng/mL (ref 30.0–100.0)

## 2020-05-20 LAB — LIPID PANEL W/O CHOL/HDL RATIO
Cholesterol, Total: 131 mg/dL (ref 100–199)
HDL: 43 mg/dL (ref >39)
LDL Chol Calc (NIH): 73 mg/dL (ref 0–99)
Triglycerides: 76 mg/dL (ref 0–149)
VLDL Cholesterol Cal: 15 mg/dL (ref 5–40)

## 2020-05-20 LAB — TSH: TSH: 1.2 u[IU]/mL (ref 0.450–4.500)

## 2020-06-09 DIAGNOSIS — M9901 Segmental and somatic dysfunction of cervical region: Secondary | ICD-10-CM | POA: Diagnosis not present

## 2020-06-09 DIAGNOSIS — M5413 Radiculopathy, cervicothoracic region: Secondary | ICD-10-CM | POA: Diagnosis not present

## 2020-07-02 DIAGNOSIS — M9901 Segmental and somatic dysfunction of cervical region: Secondary | ICD-10-CM | POA: Diagnosis not present

## 2020-07-02 DIAGNOSIS — M5413 Radiculopathy, cervicothoracic region: Secondary | ICD-10-CM | POA: Diagnosis not present

## 2020-07-03 ENCOUNTER — Ambulatory Visit (INDEPENDENT_AMBULATORY_CARE_PROVIDER_SITE_OTHER): Payer: BC Managed Care – PPO | Admitting: Adult Health

## 2020-07-03 ENCOUNTER — Other Ambulatory Visit: Payer: Self-pay

## 2020-07-03 ENCOUNTER — Encounter: Payer: Self-pay | Admitting: Adult Health

## 2020-07-03 ENCOUNTER — Encounter: Payer: Self-pay | Admitting: Nurse Practitioner

## 2020-07-03 VITALS — BP 100/62 | HR 75 | Temp 98.7°F | Resp 16 | Wt 158.2 lb

## 2020-07-03 DIAGNOSIS — Z0000 Encounter for general adult medical examination without abnormal findings: Secondary | ICD-10-CM | POA: Diagnosis not present

## 2020-07-03 DIAGNOSIS — Q5128 Other doubling of uterus, other specified: Secondary | ICD-10-CM | POA: Diagnosis not present

## 2020-07-03 DIAGNOSIS — R1011 Right upper quadrant pain: Secondary | ICD-10-CM | POA: Diagnosis not present

## 2020-07-03 DIAGNOSIS — N939 Abnormal uterine and vaginal bleeding, unspecified: Secondary | ICD-10-CM | POA: Insufficient documentation

## 2020-07-03 DIAGNOSIS — K644 Residual hemorrhoidal skin tags: Secondary | ICD-10-CM | POA: Insufficient documentation

## 2020-07-03 NOTE — Progress Notes (Signed)
Complete physical exam   Patient: Laura Carey   DOB: Nov 29, 1985   34 y.o. Female  MRN: [id number] Visit Date: 07/03/2020  Today's healthcare provider: Marcille Buffy, FNP   Chief Complaint  Patient presents with  . Annual Exam   Subjective     HPI  Laura Carey is a 34 y.o. female who presents today for a complete physical exam.  She reports consuming a general diet. The patient does not participate in regular exercise at present.  Clarified her uterus   PAP 2019  was normally due 2022.  She has had vaginal spotting 2 times since she had missed a period and then started.  Denies any concerns.   Respiratory infections.  Patient's last menstrual period was 06/08/2020.     She generally feels well. She reports sleeping fairly well, patient reports having a hard time falling back to sleep once awake. She does not have additional problems to discuss today.   Patient  denies any fever, body aches,chills, rash, chest pain, shortness of breath, nausea, vomiting, or diarrhea.  Denies dizziness, lightheadedness, pre syncopal or syncopal episodes.   History reviewed. No pertinent past medical history. Past Surgical History:  Procedure Laterality Date  . HYSTEROSCOPY  01/2017   Social History   Socioeconomic History  . Marital status: Married    Spouse name: Not on file  . Number of children: Not on file  . Years of education: Not on file  . Highest education level: Not on file  Occupational History  . Not on file  Tobacco Use  . Smoking status: Never Smoker  . Smokeless tobacco: Never Used  Vaping Use  . Vaping Use: Never used  Substance and Sexual Activity  . Alcohol use: Yes  . Drug use: No  . Sexual activity: Not Currently  Other Topics Concern  . Not on file  Social History Narrative  . Not on file   Social Determinants of Health   Financial Resource Strain:   . Difficulty of Paying Living Expenses: Not on file  Food Insecurity:   .  Worried About Charity fundraiser in the Last Year: Not on file  . Ran Out of Food in the Last Year: Not on file  Transportation Needs:   . Lack of Transportation (Medical): Not on file  . Lack of Transportation (Non-Medical): Not on file  Physical Activity:   . Days of Exercise per Week: Not on file  . Minutes of Exercise per Session: Not on file  Stress:   . Feeling of Stress : Not on file  Social Connections:   . Frequency of Communication with Friends and Family: Not on file  . Frequency of Social Gatherings with Friends and Family: Not on file  . Attends Religious Services: Not on file  . Active Member of Clubs or Organizations: Not on file  . Attends Archivist Meetings: Not on file  . Marital Status: Not on file  Intimate Partner Violence:   . Fear of Current or Ex-Partner: Not on file  . Emotionally Abused: Not on file  . Physically Abused: Not on file  . Sexually Abused: Not on file   Family Status  Relation Name Status  . Mother  Alive  . Father  Alive  . MGM  Deceased  . MGF  Deceased  . PGM  Deceased  . PGF  Deceased   Family History  Problem Relation Age of Onset  . Cancer Maternal Grandmother  breast   No Known Allergies  Patient Care Team: Rylei Masella, Kelby Aline, FNP as PCP - General (Family Medicine)   Medications: No outpatient medications prior to visit.   No facility-administered medications prior to visit.    Review of Systems  Constitutional: Positive for fatigue.  HENT: Positive for congestion and sinus pressure.   Gastrointestinal: Positive for nausea.  Musculoskeletal: Positive for back pain and neck pain.  Neurological: Positive for headaches.      Objective    BP 100/62   Pulse 75   Temp 98.7 F (37.1 C) (Oral)   Resp 16   Wt 158 lb 3.2 oz (71.8 kg)   LMP 06/08/2020   SpO2 100%   BMI 25.53 kg/m  BP Readings from Last 3 Encounters:  07/03/20 100/62  05/08/20 103/70  01/17/20 111/75   Wt Readings from Last  3 Encounters:  07/03/20 158 lb 3.2 oz (71.8 kg)  05/08/20 157 lb (71.2 kg)  09/16/19 155 lb (70.3 kg)      Physical Exam Vitals reviewed.  Constitutional:      General: She is not in acute distress.    Appearance: She is well-developed. She is not diaphoretic.     Interventions: She is not intubated. HENT:     Head: Normocephalic and atraumatic.     Right Ear: External ear normal.     Left Ear: External ear normal.     Nose: Nose normal.     Mouth/Throat:     Pharynx: No oropharyngeal exudate.  Eyes:     General: Lids are normal. No scleral icterus.       Right eye: No discharge.        Left eye: No discharge.     Conjunctiva/sclera: Conjunctivae normal.     Right eye: Right conjunctiva is not injected. No exudate or hemorrhage.    Left eye: Left conjunctiva is not injected. No exudate or hemorrhage.    Pupils: Pupils are equal, round, and reactive to light.  Neck:     Thyroid: No thyroid mass or thyromegaly.     Vascular: Normal carotid pulses. No carotid bruit, hepatojugular reflux or JVD.     Trachea: Trachea and phonation normal. No tracheal tenderness or tracheal deviation.     Meningeal: Brudzinski's sign and Kernig's sign absent.  Cardiovascular:     Rate and Rhythm: Normal rate and regular rhythm.     Pulses: Normal pulses.          Radial pulses are 2+ on the right side and 2+ on the left side.       Dorsalis pedis pulses are 2+ on the right side and 2+ on the left side.       Posterior tibial pulses are 2+ on the right side and 2+ on the left side.     Heart sounds: Normal heart sounds, S1 normal and S2 normal. Heart sounds not distant. No murmur heard.  No friction rub. No gallop.   Pulmonary:     Effort: Pulmonary effort is normal. No tachypnea, bradypnea, accessory muscle usage or respiratory distress. She is not intubated.     Breath sounds: Normal breath sounds. No stridor. No wheezing or rales.  Chest:     Chest wall: No tenderness.  Abdominal:      General: Bowel sounds are normal. There is no distension or abdominal bruit.     Palpations: Abdomen is soft. There is no shifting dullness, fluid wave, hepatomegaly, splenomegaly, mass or pulsatile mass.  Tenderness: There is no abdominal tenderness. There is no guarding or rebound.     Hernia: No hernia is present.  Musculoskeletal:        General: No tenderness or deformity. Normal range of motion.     Cervical back: Full passive range of motion without pain, normal range of motion and neck supple. No edema, erythema or rigidity. No spinous process tenderness or muscular tenderness. Normal range of motion.  Lymphadenopathy:     Head:     Right side of head: No submental, submandibular, tonsillar, preauricular, posterior auricular or occipital adenopathy.     Left side of head: No submental, submandibular, tonsillar, preauricular, posterior auricular or occipital adenopathy.     Cervical: No cervical adenopathy.     Right cervical: No superficial, deep or posterior cervical adenopathy.    Left cervical: No superficial, deep or posterior cervical adenopathy.     Upper Body:     Right upper body: No supraclavicular or pectoral adenopathy.     Left upper body: No supraclavicular or pectoral adenopathy.  Skin:    General: Skin is warm and dry.     Coloration: Skin is not pale.     Findings: No abrasion, bruising, burn, ecchymosis, erythema, lesion, petechiae or rash.     Nails: There is no clubbing.  Neurological:     Mental Status: She is alert and oriented to person, place, and time.     GCS: GCS eye subscore is 4. GCS verbal subscore is 5. GCS motor subscore is 6.     Cranial Nerves: No cranial nerve deficit.     Sensory: No sensory deficit.     Motor: No tremor, atrophy, abnormal muscle tone or seizure activity.     Coordination: Coordination normal.     Gait: Gait normal.     Deep Tendon Reflexes: Reflexes are normal and symmetric. Reflexes normal. Babinski sign absent on the  right side. Babinski sign absent on the left side.     Reflex Scores:      Tricep reflexes are 2+ on the right side and 2+ on the left side.      Bicep reflexes are 2+ on the right side and 2+ on the left side.      Brachioradialis reflexes are 2+ on the right side and 2+ on the left side.      Patellar reflexes are 2+ on the right side and 2+ on the left side.      Achilles reflexes are 2+ on the right side and 2+ on the left side. Psychiatric:        Speech: Speech normal.        Behavior: Behavior normal.        Thought Content: Thought content normal.        Judgment: Judgment normal.       Last depression screening scores PHQ 2/9 Scores 07/03/2020 05/08/2020 06/28/2019  PHQ - 2 Score 1 1 0  PHQ- 9 Score '3 2 2   ' Last fall risk screening Fall Risk  07/03/2020  Falls in the past year? 0  Number falls in past yr: 0  Injury with Fall? 0   Last Audit-C alcohol use screening Alcohol Use Disorder Test (AUDIT) 05/08/2020  1. How often do you have a drink containing alcohol? 2  2. How many drinks containing alcohol do you have on a typical day when you are drinking? 0  3. How often do you have six or more drinks on one occasion?  0  AUDIT-C Score 2   A score of 3 or more in women, and 4 or more in men indicates increased risk for alcohol abuse, EXCEPT if all of the points are from question 1   Results for orders placed or performed in visit on 07/03/20  Amylase  Result Value Ref Range   Amylase 45 31 - 110 U/L  Lipase  Result Value Ref Range   Lipase 30 14 - 72 U/L  Hepatic function panel  Result Value Ref Range   Total Protein 7.9 6.0 - 8.5 g/dL   Albumin 5.2 (H) 3.8 - 4.8 g/dL   Bilirubin Total 1.8 (H) 0.0 - 1.2 mg/dL   Bilirubin, Direct 0.36 0.00 - 0.40 mg/dL   Alkaline Phosphatase 67 44 - 121 IU/L   AST 15 0 - 40 IU/L   ALT 9 0 - 32 IU/L    Assessment & Plan    Routine Health Maintenance and Physical Exam  Exercise Activities and Dietary recommendations Goals     None     Immunization History  Administered Date(s) Administered  . MMR 08/12/2013  . Tdap 01/05/2018    Health Maintenance  Topic Date Due  . Hepatitis C Screening  Never done  . COVID-19 Vaccine (1) Never done  . PAP SMEAR-Modifier  06/09/2019  . INFLUENZA VACCINE  11/12/2020 (Originally 03/15/2020)  . TETANUS/TDAP  01/06/2028  . HIV Screening  Completed    Discussed health benefits of physical activity, and encouraged her to engage in regular exercise appropriate for her age and condition.  Orders Placed This Encounter  Procedures  . US Abdomen Limited RUQ (LIVER/GB)  . Amylase  . Lipase  . Bilirubin, Total  . Bilirubin, Direct  . Hepatic function panel  . Ambulatory referral to Gastroenterology  . Ambulatory referral to Obstetrics / Gynecology    Return in about 3 months (around 10/03/2020), or if symptoms worsen or fail to improve, for Go to Emergency room/ urgent care if worse.     Addressed acute and or chronic medical problems today requiring 48 minutes reviewing patients medical record,labs, counseling patient regarding patient's conditions, any medications, answering questions regarding health, and coordination of care as needed. After visit summary patient given copy and reviewed.   Marcille Buffy, [place] [phone] (phone) [phone] (fax)  [place]

## 2020-07-03 NOTE — Patient Instructions (Signed)
 Health Maintenance, Female Adopting a healthy lifestyle and getting preventive care are important in promoting health and wellness. Ask your health care provider about:  The right schedule for you to have regular tests and exams.  Things you can do on your own to prevent diseases and keep yourself healthy. What should I know about diet, weight, and exercise? Eat a healthy diet   Eat a diet that includes plenty of vegetables, fruits, low-fat dairy products, and lean protein.  Do not eat a lot of foods that are high in solid fats, added sugars, or sodium. Maintain a healthy weight Body mass index (BMI) is used to identify weight problems. It estimates body fat based on height and weight. Your health care provider can help determine your BMI and help you achieve or maintain a healthy weight. Get regular exercise Get regular exercise. This is one of the most important things you can do for your health. Most adults should:  Exercise for at least 150 minutes each week. The exercise should increase your heart rate and make you sweat (moderate-intensity exercise).  Do strengthening exercises at least twice a week. This is in addition to the moderate-intensity exercise.  Spend less time sitting. Even light physical activity can be beneficial. Watch cholesterol and blood lipids Have your blood tested for lipids and cholesterol at 34 years of age, then have this test every 5 years. Have your cholesterol levels checked more often if:  Your lipid or cholesterol levels are high.  You are older than 34 years of age.  You are at high risk for heart disease. What should I know about cancer screening? Depending on your health history and family history, you may need to have cancer screening at various ages. This may include screening for:  Breast cancer.  Cervical cancer.  Colorectal cancer.  Skin cancer.  Lung cancer. What should I know about heart disease, diabetes, and high blood  pressure? Blood pressure and heart disease  High blood pressure causes heart disease and increases the risk of stroke. This is more likely to develop in people who have high blood pressure readings, are of African descent, or are overweight.  Have your blood pressure checked: ? Every 3-5 years if you are 18-39 years of age. ? Every year if you are 40 years old or older. Diabetes Have regular diabetes screenings. This checks your fasting blood sugar level. Have the screening done:  Once every three years after age 40 if you are at a normal weight and have a low risk for diabetes.  More often and at a younger age if you are overweight or have a high risk for diabetes. What should I know about preventing infection? Hepatitis B If you have a higher risk for hepatitis B, you should be screened for this virus. Talk with your health care provider to find out if you are at risk for hepatitis B infection. Hepatitis C Testing is recommended for:  Everyone born from 1945 through 1965.  Anyone with known risk factors for hepatitis C. Sexually transmitted infections (STIs)  Get screened for STIs, including gonorrhea and chlamydia, if: ? You are sexually active and are younger than 34 years of age. ? You are older than 34 years of age and your health care provider tells you that you are at risk for this type of infection. ? Your sexual activity has changed since you were last screened, and you are at increased risk for chlamydia or gonorrhea. Ask your health care provider   if you are at risk.  Ask your health care provider about whether you are at high risk for HIV. Your health care provider may recommend a prescription medicine to help prevent HIV infection. If you choose to take medicine to prevent HIV, you should first get tested for HIV. You should then be tested every 3 months for as long as you are taking the medicine. Pregnancy  If you are about to stop having your period (premenopausal) and  you may become pregnant, seek counseling before you get pregnant.  Take 400 to 800 micrograms (mcg) of folic acid every day if you become pregnant.  Ask for birth control (contraception) if you want to prevent pregnancy. Osteoporosis and menopause Osteoporosis is a disease in which the bones lose minerals and strength with aging. This can result in bone fractures. If you are 65 years old or older, or if you are at risk for osteoporosis and fractures, ask your health care provider if you should:  Be screened for bone loss.  Take a calcium or vitamin D supplement to lower your risk of fractures.  Be given hormone replacement therapy (HRT) to treat symptoms of menopause. Follow these instructions at home: Lifestyle  Do not use any products that contain nicotine or tobacco, such as cigarettes, e-cigarettes, and chewing tobacco. If you need help quitting, ask your health care provider.  Do not use street drugs.  Do not share needles.  Ask your health care provider for help if you need support or information about quitting drugs. Alcohol use  Do not drink alcohol if: ? Your health care provider tells you not to drink. ? You are pregnant, may be pregnant, or are planning to become pregnant.  If you drink alcohol: ? Limit how much you use to 0-1 drink a day. ? Limit intake if you are breastfeeding.  Be aware of how much alcohol is in your drink. In the [place] one drink equals one 12 oz bottle of beer (355 mL), one 5 oz glass of wine (148 mL), or one 1 oz glass of hard liquor (44 mL). General instructions  Schedule regular health, dental, and eye exams.  Stay current with your vaccines.  Tell your health care provider if: ? You often feel depressed. ? You have ever been abused or do not feel safe at home. Summary  Adopting a healthy lifestyle and getting preventive care are important in promoting health and wellness.  Follow your health care provider's instructions about healthy  diet, exercising, and getting tested or screened for diseases.  Follow your health care provider's instructions on monitoring your cholesterol and blood pressure. This information is not intended to replace advice given to you by your health care provider. Make sure you discuss any questions you have with your health care provider. Document Revised: 07/25/2018 Document Reviewed: 07/25/2018 Elsevier Patient Education  2020 [employer]  Breast Self-Awareness Breast self-awareness is knowing how your breasts look and feel. Doing breast self-awareness is important. It allows you to catch a breast problem early while it is still small and can be treated. All women should do breast self-awareness, including women who have had breast implants. Tell your doctor if you notice a change in your breasts. What you need:  A mirror.  A well-lit room. How to do a breast self-exam A breast self-exam is one way to learn what is normal for your breasts and to check for changes. To do a breast self-exam: Look for changes  1. Take off all   the clothes above your waist. 2. Stand in front of a mirror in a room with good lighting. 3. Put your hands on your hips. 4. Push your hands down. 5. Look at your breasts and nipples in the mirror to see if one breast or nipple looks different from the other. Check to see if: ? The shape of one breast is different. ? The size of one breast is different. ? There are wrinkles, dips, and bumps in one breast and not the other. 6. Look at each breast for changes in the skin, such as: ? Redness. ? Scaly areas. 7. Look for changes in your nipples, such as: ? Liquid around the nipples. ? Bleeding. ? Dimpling. ? Redness. ? A change in where the nipples are. Feel for changes  1. Lie on your back on the floor. 2. Feel each breast. To do this, follow these steps: ? Pick a breast to feel. ? Put the arm closest to that breast above your head. ? Use your other arm to feel  the nipple area of your breast. Feel the area with the pads of your three middle fingers by making small circles with your fingers. For the first circle, press lightly. For the second circle, press harder. For the third circle, press even harder. ? Keep making circles with your fingers at the different pressures as you move down your breast. Stop when you feel your ribs. ? Move your fingers a little toward the center of your body. ? Start making circles with your fingers again, this time going up until you reach your collarbone. ? Keep making up-and-down circles until you reach your armpit. Remember to keep using the three pressures. ? Feel the other breast in the same way. 3. Sit or stand in the tub or shower. 4. With soapy water on your skin, feel each breast the same way you did in step 2 when you were lying on the floor. Write down what you find Writing down what you find can help you remember what to tell your doctor. Write down:  What is normal for each breast.  Any changes you find in each breast, including: ? The kind of changes you find. ? Whether you have pain. ? Size and location of any lumps.  When you last had your menstrual period. General tips  Check your breasts every month.  If you are breastfeeding, the best time to check your breasts is after you feed your baby or after you use a breast pump.  If you get menstrual periods, the best time to check your breasts is 5-7 days after your menstrual period is over.  With time, you will become comfortable with the self-exam, and you will begin to know if there are changes in your breasts. Contact a doctor if you:  See a change in the shape or size of your breasts or nipples.  See a change in the skin of your breast or nipples, such as red or scaly skin.  Have fluid coming from your nipples that is not normal.  Find a lump or thick area that was not there before.  Have pain in your breasts.  Have any concerns about  your breast health. Summary  Breast self-awareness includes looking for changes in your breasts, as well as feeling for changes within your breasts.  Breast self-awareness should be done in front of a mirror in a well-lit room.  You should check your breasts every month. If you get menstrual periods, the best   time to check your breasts is 5-7 days after your menstrual period is over.  Let your doctor know of any changes you see in your breasts, including changes in size, changes on the skin, pain or tenderness, or fluid from your nipples that is not normal. This information is not intended to replace advice given to you by your health care provider. Make sure you discuss any questions you have with your health care provider. Document Revised: 03/20/2018 Document Reviewed: 03/20/2018 Elsevier Patient Education  2020 Elsevier Inc.  

## 2020-07-04 LAB — HEPATIC FUNCTION PANEL
ALT: 9 IU/L (ref 0–32)
AST: 15 IU/L (ref 0–40)
Albumin: 5.2 g/dL — ABNORMAL HIGH (ref 3.8–4.8)
Alkaline Phosphatase: 67 IU/L (ref 44–121)
Bilirubin Total: 1.8 mg/dL — ABNORMAL HIGH (ref 0.0–1.2)
Bilirubin, Direct: 0.36 mg/dL (ref 0.00–0.40)
Total Protein: 7.9 g/dL (ref 6.0–8.5)

## 2020-07-04 LAB — AMYLASE: Amylase: 45 U/L (ref 31–110)

## 2020-07-04 LAB — LIPASE: Lipase: 30 U/L (ref 14–72)

## 2020-07-06 NOTE — Progress Notes (Signed)
Amylase and lipase is within normal limits.  Total bilirubin still elevated and albumin, she has been referred to GI for further work up.

## 2020-07-13 ENCOUNTER — Other Ambulatory Visit: Payer: Self-pay

## 2020-07-13 ENCOUNTER — Ambulatory Visit
Admission: RE | Admit: 2020-07-13 | Discharge: 2020-07-13 | Disposition: A | Discharge status: Home / Self Care | Payer: BC Managed Care – PPO | Source: Ambulatory Visit | Attending: Adult Health | Admitting: Adult Health

## 2020-07-13 DIAGNOSIS — R1011 Right upper quadrant pain: Secondary | ICD-10-CM | POA: Diagnosis not present

## 2020-07-13 DIAGNOSIS — R109 Unspecified abdominal pain: Secondary | ICD-10-CM | POA: Diagnosis not present

## 2020-07-13 NOTE — Progress Notes (Signed)
Within normal ultrasound Right upper quadrant.

## 2020-08-21 ENCOUNTER — Other Ambulatory Visit (HOSPITAL_COMMUNITY)
Admission: RE | Admit: 2020-08-21 | Discharge: 2020-08-21 | Disposition: A | Discharge status: Home / Self Care | Payer: BC Managed Care – PPO | Source: Ambulatory Visit | Attending: Certified Nurse Midwife | Admitting: Certified Nurse Midwife

## 2020-08-21 ENCOUNTER — Ambulatory Visit (INDEPENDENT_AMBULATORY_CARE_PROVIDER_SITE_OTHER): Payer: BC Managed Care – PPO | Admitting: Certified Nurse Midwife

## 2020-08-21 ENCOUNTER — Encounter: Payer: Self-pay | Admitting: Adult Health

## 2020-08-21 ENCOUNTER — Other Ambulatory Visit: Payer: Self-pay

## 2020-08-21 ENCOUNTER — Encounter: Payer: Self-pay | Admitting: Certified Nurse Midwife

## 2020-08-21 VITALS — BP 119/77 | HR 67 | Ht 66.0 in | Wt 159.1 lb

## 2020-08-21 DIAGNOSIS — Z01419 Encounter for gynecological examination (general) (routine) without abnormal findings: Secondary | ICD-10-CM | POA: Diagnosis not present

## 2020-08-21 DIAGNOSIS — N926 Irregular menstruation, unspecified: Secondary | ICD-10-CM

## 2020-08-21 DIAGNOSIS — Z124 Encounter for screening for malignant neoplasm of cervix: Secondary | ICD-10-CM | POA: Insufficient documentation

## 2020-08-21 DIAGNOSIS — Q5128 Other doubling of uterus, other specified: Secondary | ICD-10-CM | POA: Diagnosis not present

## 2020-08-21 NOTE — Progress Notes (Signed)
ANNUAL PREVENTATIVE CARE GYN  ENCOUNTER NOTE  Subjective:       Laura Carey is a 35 y.o. G26P1021 female here for a routine annual gynecologic exam.  Current complaints: 1. Irregular menses-nine (9) days late at this time  History of septate uterus revision. Denies difficulty breathing or respiratory distress, chest pain, abdominal pain, excessive vaginal bleeding, dysuria, and leg pain or selling.    Gynecologic History  Patient's last menstrual period was 07/15/2020 (exact date). Period Duration (Days): Seven (7) Period Pattern: (!) Irregular Menstrual Flow: Moderate,Heavy Menstrual Control: Maxi pad,Tampon Menstrual Control Change Freq (Hours): Three (3) to four (4) Dysmenorrhea: (!) Moderate Dysmenorrhea Symptoms: Nausea,Cramping  Contraception: none  Last Pap: 2019. Results were: normal  Obstetric History  OB History  Gravida Para Term Preterm AB Living  3 1 1   2 1   SAB IAB Ectopic Multiple Live Births  2       1    # Outcome Date GA Lbr Len/2nd Weight Sex Delivery Anes PTL Lv  3 Term 03/17/18 [redacted]w[redacted]d  7 lb 15 oz (3.6 kg) F Vag-Spont EPI N LIV  2 SAB 2017 [redacted]w[redacted]d         1 SAB 2016 [redacted]w[redacted]d           No past medical history on file.  Past Surgical History:  Procedure Laterality Date  . HYSTEROSCOPY  01/2017   No Known Allergies  Social History   Socioeconomic History  . Marital status: Married    Spouse name: Not on file  . Number of children: Not on file  . Years of education: Not on file  . Highest education level: Not on file  Occupational History  . Not on file  Tobacco Use  . Smoking status: Never Smoker  . Smokeless tobacco: Never Used  Vaping Use  . Vaping Use: Never used  Substance and Sexual Activity  . Alcohol use: Yes    Comment: social  . Drug use: No  . Sexual activity: Yes    Birth control/protection: None  Other Topics Concern  . Not on file  Social History Narrative  . Not on file   Social Determinants of Health   Financial  Resource Strain: Not on file  Food Insecurity: Not on file  Transportation Needs: Not on file  Physical Activity: Not on file  Stress: Not on file  Social Connections: Not on file  Intimate Partner Violence: Not on file    Family History  Problem Relation Age of Onset  . Cancer Maternal Grandmother        breast    The following portions of the patient's history were reviewed and updated as appropriate: allergies, current medications, past family history, past medical history, past social history, past surgical history and problem list.  Review of Systems  ROS negative except as noted above. Information obtained from patient.    Objective:   BP 119/77   Pulse 67   Ht 5\' 6"  (1.676 m)   Wt 159 lb 2 oz (72.2 kg)   LMP 07/15/2020 (Exact Date)   BMI 25.68 kg/m   CONSTITUTIONAL: Well-developed, well-nourished female in no acute distress.   PSYCHIATRIC: Normal mood and affect. Normal behavior. Normal judgment and thought content.  NEUROLGIC: Alert and oriented to person, place, and time. Normal muscle tone coordination. No cranial nerve deficit noted.  HENT:  Normocephalic, atraumatic, External right and left ear normal.   EYES: Conjunctivae and EOM are normal. Pupils are equal and round.  NECK: Normal range of motion, supple, no masses.  Normal thyroid.   SKIN: Skin is warm and dry. No rash noted. Not diaphoretic. No erythema. No pallor.  CARDIOVASCULAR: Normal heart rate noted, regular rhythm, no murmur.  RESPIRATORY: Clear to auscultation bilaterally. Effort and breath sounds normal, no problems with respiration noted.  BREASTS: Symmetric in size. No masses, skin changes, nipple drainage, or lymphadenopathy.  ABDOMEN: Soft, normal bowel sounds, no distention noted.  No tenderness, rebound or guarding.   PELVIC:  External Genitalia: Normal  Vagina: Normal  Cervix: Normal, Pap collected  Uterus: Normal  Adnexa: Normal   MUSCULOSKELETAL: Normal range of motion.  No tenderness.  No cyanosis, clubbing, or edema.  2+ distal pulses.  LYMPHATIC: No Axillary, Supraclavicular, or Inguinal Adenopathy.  Assessment:   Annual gynecologic examination 35 y.o.   Contraception: none   Normal BMI   Problem List Items Addressed This Visit   None   Visit Diagnoses    Well woman exam    -  Primary   Relevant Orders   FSH/LH   Estradiol   Screening for cervical cancer       Irregular menses       Relevant Orders   FSH/LH   Estradiol   Uterus, septate       Relevant Orders   FSH/LH   Estradiol      Plan:   Pap: Pap, Reflex if ASCUS  Labs: See orders; will schedule ultrasound if labs are abnormal  Routine preventative health maintenance measures emphasized: Exercise/Diet/Weight control, Tobacco Warnings, Alcohol/Substance use risks, Stress Management and Peer Pressure Issues; see AVS  Reviewed red flag symptoms and when to call  Return to Clinic - 1 Year for [facility] or sooner if needed   Serafina Royals, CNM  [facility], [facility] 08/21/20 3:54 PM

## 2020-08-21 NOTE — Patient Instructions (Signed)
Preventive Care 21-35 Years Old, Female Preventive care refers to visits with your health care provider and lifestyle choices that can promote health and wellness. This includes:  A yearly physical exam. This may also be called an annual well check.  Regular dental visits and eye exams.  Immunizations.  Screening for certain conditions.  Healthy lifestyle choices, such as eating a healthy diet, getting regular exercise, not using drugs or products that contain nicotine and tobacco, and limiting alcohol use. What can I expect for my preventive care visit? Physical exam Your health care provider will check your:  Height and weight. This may be used to calculate body mass index (BMI), which tells if you are at a healthy weight.  Heart rate and blood pressure.  Skin for abnormal spots. Counseling Your health care provider may ask you questions about your:  Alcohol, tobacco, and drug use.  Emotional well-being.  Home and relationship well-being.  Sexual activity.  Eating habits.  Work and work environment.  Method of birth control.  Menstrual cycle.  Pregnancy history. What immunizations do I need?  Influenza (flu) vaccine  This is recommended every year. Tetanus, diphtheria, and pertussis (Tdap) vaccine  You may need a Td booster every 10 years. Varicella (chickenpox) vaccine  You may need this if you have not been vaccinated. Human papillomavirus (HPV) vaccine  If recommended by your health care provider, you may need three doses over 6 months. Measles, mumps, and rubella (MMR) vaccine  You may need at least one dose of MMR. You may also need a second dose. Meningococcal conjugate (MenACWY) vaccine  One dose is recommended if you are age 19-21 years and a first-year college student living in a residence hall, or if you have one of several medical conditions. You may also need additional booster doses. Pneumococcal conjugate (PCV13) vaccine  You may need  this if you have certain conditions and were not previously vaccinated. Pneumococcal polysaccharide (PPSV23) vaccine  You may need one or two doses if you smoke cigarettes or if you have certain conditions. Hepatitis A vaccine  You may need this if you have certain conditions or if you travel or work in places where you may be exposed to hepatitis A. Hepatitis B vaccine  You may need this if you have certain conditions or if you travel or work in places where you may be exposed to hepatitis B. Haemophilus influenzae type b (Hib) vaccine  You may need this if you have certain conditions. You may receive vaccines as individual doses or as more than one vaccine together in one shot (combination vaccines). Talk with your health care provider about the risks and benefits of combination vaccines. What tests do I need?  Blood tests  Lipid and cholesterol levels. These may be checked every 5 years starting at age 20.  Hepatitis C test.  Hepatitis B test. Screening  Diabetes screening. This is done by checking your blood sugar (glucose) after you have not eaten for a while (fasting).  Sexually transmitted disease (STD) testing.  BRCA-related cancer screening. This may be done if you have a family history of breast, ovarian, tubal, or peritoneal cancers.  Pelvic exam and Pap test. This may be done every 3 years starting at age 21. Starting at age 30, this may be done every 5 years if you have a Pap test in combination with an HPV test. Talk with your health care provider about your test results, treatment options, and if necessary, the need for more tests.   Follow these instructions at home: Eating and drinking   Eat a diet that includes fresh fruits and vegetables, whole grains, lean protein, and low-fat dairy.  Take vitamin and mineral supplements as recommended by your health care provider.  Do not drink alcohol if: ? Your health care provider tells you not to drink. ? You are  pregnant, may be pregnant, or are planning to become pregnant.  If you drink alcohol: ? Limit how much you have to 0-1 drink a day. ? Be aware of how much alcohol is in your drink. In the [place] one drink equals one 12 oz bottle of beer (355 mL), one 5 oz glass of wine (148 mL), or one 1 oz glass of hard liquor (44 mL). Lifestyle  Take daily care of your teeth and gums.  Stay active. Exercise for at least 30 minutes on 5 or more days each week.  Do not use any products that contain nicotine or tobacco, such as cigarettes, e-cigarettes, and chewing tobacco. If you need help quitting, ask your health care provider.  If you are sexually active, practice safe sex. Use a condom or other form of birth control (contraception) in order to prevent pregnancy and STIs (sexually transmitted infections). If you plan to become pregnant, see your health care provider for a preconception visit. What's next?  Visit your health care provider once a year for a well check visit.  Ask your health care provider how often you should have your eyes and teeth checked.  Stay up to date on all vaccines. This information is not intended to replace advice given to you by your health care provider. Make sure you discuss any questions you have with your health care provider. Document Revised: 04/12/2018 Document Reviewed: 04/12/2018 Elsevier Patient Education  2020 [employer].

## 2020-08-27 ENCOUNTER — Encounter: Payer: Self-pay | Admitting: Gastroenterology

## 2020-08-27 ENCOUNTER — Other Ambulatory Visit: Payer: Self-pay

## 2020-08-27 ENCOUNTER — Ambulatory Visit (INDEPENDENT_AMBULATORY_CARE_PROVIDER_SITE_OTHER): Payer: BC Managed Care – PPO | Admitting: Gastroenterology

## 2020-08-27 VITALS — BP 113/78 | HR 65 | Temp 98.3°F | Ht 66.0 in | Wt 159.4 lb

## 2020-08-27 DIAGNOSIS — R1013 Epigastric pain: Secondary | ICD-10-CM | POA: Diagnosis not present

## 2020-08-27 LAB — CYTOLOGY - PAP
Comment: NEGATIVE
Diagnosis: NEGATIVE
High risk HPV: NEGATIVE

## 2020-08-27 NOTE — Progress Notes (Signed)
Arlyss Repress, MD [address]  [address]  [place], [place] [address]  Main: [phone]  Fax: [phone]    Gastroenterology Consultation  Referring Provider:     Stephanie Acre* Primary Care Physician:  Berniece Pap, FNP Primary Gastroenterologist:  Dr. Arlyss Repress Reason for Consultation:     Epigastric pain        HPI:   Laura Carey is a 35 y.o. female referred by Dr. Berniece Pap, FNP  for consultation & management of epigastric pain.  Patient has been experiencing intermittent, sporadic episodes of sharp epigastric pain symptoms associated with nausea which is usually relieved after taking Pepto-Bismol, omeprazole for 2 to 3 days.  Her last episode was in October.  Her work-up revealed mildly elevated total bilirubin which was predominantly indirect bilirubin, subsequently ultrasound was unremarkable without any gallstones.  Patient is originally from [place], lives in [place] with her husband and her 38-year-old child.  She says that several years ago, when she was in [place] she underwent unsedated upper endoscopy for epigastric pain and she was told that she had an ulcer in her stomach for which she was treated.  She does not know if she had H. pylori infection.  She avoids alcohol use.  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  Ulcer in her stomach  History reviewed. No pertinent past medical history.  Past Surgical History:  Procedure Laterality Date  . HYSTEROSCOPY  01/2017   No current outpatient medications on file.  Family History  Problem Relation Age of Onset  . Cancer Maternal Grandmother        breast     Social History   Tobacco Use  . Smoking status: Never Smoker  . Smokeless  tobacco: Never Used  Vaping Use  . Vaping Use: Never used  Substance Use Topics  . Alcohol use: Yes    Comment: social  . Drug use: No    Allergies as of 08/27/2020  . (No Known Allergies)    Review of Systems:    All systems reviewed and negative except where noted in HPI.   Physical Exam:  BP 113/78   Pulse 65   Temp 98.3 F (36.8 C) (Oral)   Ht 5\' 6"  (1.676 m)   Wt 159 lb 6.4 oz (72.3 kg)   BMI 25.73 kg/m  No LMP recorded.  General:   Alert,  Well-developed, well-nourished, pleasant and cooperative in NAD Head:  Normocephalic and atraumatic. Eyes:  Sclera clear, no icterus.   Conjunctiva pink. Ears:  Normal auditory acuity. Nose:  No deformity, discharge, or lesions. Mouth:  No deformity or lesions,oropharynx pink & moist. Neck:  Supple; no masses or thyromegaly. Lungs:  Respirations even and unlabored.  Clear throughout to auscultation.   No wheezes, crackles, or rhonchi. No acute distress. Heart:  Regular rate and rhythm; no murmurs, clicks, rubs, or gallops. Abdomen:  Normal bowel sounds. Soft, very mild epigastric tenderness and non-distended without masses, hepatosplenomegaly or hernias noted.  No guarding or rebound tenderness.   Rectal: Not performed Msk:  Symmetrical without gross deformities. Good, equal movement & strength bilaterally. Pulses:  Normal pulses noted. Extremities:  No clubbing or edema.  No cyanosis. Neurologic:  Alert and oriented x3;  grossly normal neurologically. Skin:  Intact without significant lesions or rashes. No jaundice. Psych:  Alert and cooperative. Normal mood and affect.  Imaging Studies: Reviewed  Assessment and Plan:   Laura Carey is a 35 y.o. pleasant Caucasian female with intermittent episodes of localized epigastric pain associated with nausea, ?  Gastric ulcer without any other constitutional symptoms.  No evidence of cholelithiasis.  Epigastric pain Recommend H. pylori breath test and treat if positive Also,  discussed about upper endoscopy if her episodes are frequent Advised her to contact me via MyChart if she develops another flareup  Elevated total bilirubin which is predominantly indirect bilirubin Consistent with Gilbert's syndrome Ultrasound abdomen is unremarkable   Follow up as needed   Arlyss Repress, MD

## 2020-08-28 LAB — H. PYLORI BREATH TEST: H pylori Breath Test: NEGATIVE

## 2020-08-31 ENCOUNTER — Other Ambulatory Visit: Payer: BC Managed Care – PPO

## 2020-09-01 ENCOUNTER — Other Ambulatory Visit: Payer: BC Managed Care – PPO

## 2020-09-03 ENCOUNTER — Other Ambulatory Visit: Payer: Self-pay

## 2020-09-03 ENCOUNTER — Other Ambulatory Visit: Payer: BC Managed Care – PPO

## 2020-09-03 DIAGNOSIS — Z01419 Encounter for gynecological examination (general) (routine) without abnormal findings: Secondary | ICD-10-CM | POA: Diagnosis not present

## 2020-09-03 DIAGNOSIS — N926 Irregular menstruation, unspecified: Secondary | ICD-10-CM | POA: Diagnosis not present

## 2020-09-03 DIAGNOSIS — Q5128 Other doubling of uterus, other specified: Secondary | ICD-10-CM | POA: Diagnosis not present

## 2020-09-04 LAB — ESTRADIOL: Estradiol: 63.5 pg/mL

## 2020-09-04 LAB — FSH/LH
FSH: 5.4 m[IU]/mL
LH: 9.7 m[IU]/mL

## 2020-10-16 ENCOUNTER — Other Ambulatory Visit: Payer: Self-pay | Admitting: Certified Nurse Midwife

## 2020-10-16 DIAGNOSIS — N926 Irregular menstruation, unspecified: Secondary | ICD-10-CM

## 2020-10-16 DIAGNOSIS — Q5128 Other doubling of uterus, other specified: Secondary | ICD-10-CM

## 2020-10-16 NOTE — Progress Notes (Signed)
Ultrasound ordered, see chart.    Serafina Royals, CNM [facility], [facility] 10/16/20 4:40 PM

## 2020-10-22 ENCOUNTER — Encounter: Payer: BC Managed Care – PPO | Admitting: Certified Nurse Midwife

## 2020-10-22 ENCOUNTER — Other Ambulatory Visit: Payer: BC Managed Care – PPO

## 2020-10-28 ENCOUNTER — Other Ambulatory Visit: Payer: Self-pay

## 2020-10-28 ENCOUNTER — Ambulatory Visit
Admission: RE | Admit: 2020-10-28 | Discharge: 2020-10-28 | Disposition: A | Discharge status: Home / Self Care | Payer: BC Managed Care – PPO | Source: Ambulatory Visit | Attending: Certified Nurse Midwife | Admitting: Certified Nurse Midwife

## 2020-10-28 DIAGNOSIS — Q5128 Other doubling of uterus, other specified: Secondary | ICD-10-CM | POA: Diagnosis not present

## 2020-10-28 DIAGNOSIS — N926 Irregular menstruation, unspecified: Secondary | ICD-10-CM | POA: Diagnosis not present

## 2020-12-17 DIAGNOSIS — M62838 Other muscle spasm: Secondary | ICD-10-CM | POA: Diagnosis not present

## 2020-12-17 DIAGNOSIS — M5413 Radiculopathy, cervicothoracic region: Secondary | ICD-10-CM | POA: Diagnosis not present

## 2020-12-17 DIAGNOSIS — M9901 Segmental and somatic dysfunction of cervical region: Secondary | ICD-10-CM | POA: Diagnosis not present

## 2020-12-17 DIAGNOSIS — G44209 Tension-type headache, unspecified, not intractable: Secondary | ICD-10-CM | POA: Diagnosis not present

## 2020-12-21 DIAGNOSIS — M9901 Segmental and somatic dysfunction of cervical region: Secondary | ICD-10-CM | POA: Diagnosis not present

## 2020-12-21 DIAGNOSIS — G44209 Tension-type headache, unspecified, not intractable: Secondary | ICD-10-CM | POA: Diagnosis not present

## 2020-12-21 DIAGNOSIS — M62838 Other muscle spasm: Secondary | ICD-10-CM | POA: Diagnosis not present

## 2020-12-21 DIAGNOSIS — M5413 Radiculopathy, cervicothoracic region: Secondary | ICD-10-CM | POA: Diagnosis not present

## 2020-12-24 DIAGNOSIS — M62838 Other muscle spasm: Secondary | ICD-10-CM | POA: Diagnosis not present

## 2020-12-24 DIAGNOSIS — M5413 Radiculopathy, cervicothoracic region: Secondary | ICD-10-CM | POA: Diagnosis not present

## 2020-12-24 DIAGNOSIS — G44209 Tension-type headache, unspecified, not intractable: Secondary | ICD-10-CM | POA: Diagnosis not present

## 2020-12-24 DIAGNOSIS — M9901 Segmental and somatic dysfunction of cervical region: Secondary | ICD-10-CM | POA: Diagnosis not present

## 2020-12-29 DIAGNOSIS — M5413 Radiculopathy, cervicothoracic region: Secondary | ICD-10-CM | POA: Diagnosis not present

## 2020-12-29 DIAGNOSIS — M62838 Other muscle spasm: Secondary | ICD-10-CM | POA: Diagnosis not present

## 2020-12-29 DIAGNOSIS — M9901 Segmental and somatic dysfunction of cervical region: Secondary | ICD-10-CM | POA: Diagnosis not present

## 2020-12-29 DIAGNOSIS — G44209 Tension-type headache, unspecified, not intractable: Secondary | ICD-10-CM | POA: Diagnosis not present

## 2020-12-31 DIAGNOSIS — M9901 Segmental and somatic dysfunction of cervical region: Secondary | ICD-10-CM | POA: Diagnosis not present

## 2020-12-31 DIAGNOSIS — G44209 Tension-type headache, unspecified, not intractable: Secondary | ICD-10-CM | POA: Diagnosis not present

## 2020-12-31 DIAGNOSIS — M62838 Other muscle spasm: Secondary | ICD-10-CM | POA: Diagnosis not present

## 2020-12-31 DIAGNOSIS — M5413 Radiculopathy, cervicothoracic region: Secondary | ICD-10-CM | POA: Diagnosis not present

## 2021-01-12 DIAGNOSIS — M62838 Other muscle spasm: Secondary | ICD-10-CM | POA: Diagnosis not present

## 2021-01-12 DIAGNOSIS — M9901 Segmental and somatic dysfunction of cervical region: Secondary | ICD-10-CM | POA: Diagnosis not present

## 2021-01-12 DIAGNOSIS — M5413 Radiculopathy, cervicothoracic region: Secondary | ICD-10-CM | POA: Diagnosis not present

## 2021-01-12 DIAGNOSIS — G44209 Tension-type headache, unspecified, not intractable: Secondary | ICD-10-CM | POA: Diagnosis not present

## 2021-01-26 DIAGNOSIS — M62838 Other muscle spasm: Secondary | ICD-10-CM | POA: Diagnosis not present

## 2021-01-26 DIAGNOSIS — G44209 Tension-type headache, unspecified, not intractable: Secondary | ICD-10-CM | POA: Diagnosis not present

## 2021-01-26 DIAGNOSIS — M5413 Radiculopathy, cervicothoracic region: Secondary | ICD-10-CM | POA: Diagnosis not present

## 2021-01-26 DIAGNOSIS — M9901 Segmental and somatic dysfunction of cervical region: Secondary | ICD-10-CM | POA: Diagnosis not present

## 2021-02-25 DIAGNOSIS — M62838 Other muscle spasm: Secondary | ICD-10-CM | POA: Diagnosis not present

## 2021-02-25 DIAGNOSIS — M5413 Radiculopathy, cervicothoracic region: Secondary | ICD-10-CM | POA: Diagnosis not present

## 2021-02-25 DIAGNOSIS — M9901 Segmental and somatic dysfunction of cervical region: Secondary | ICD-10-CM | POA: Diagnosis not present

## 2021-02-25 DIAGNOSIS — G44209 Tension-type headache, unspecified, not intractable: Secondary | ICD-10-CM | POA: Diagnosis not present

## 2021-05-04 DIAGNOSIS — G44209 Tension-type headache, unspecified, not intractable: Secondary | ICD-10-CM | POA: Diagnosis not present

## 2021-05-04 DIAGNOSIS — M5413 Radiculopathy, cervicothoracic region: Secondary | ICD-10-CM | POA: Diagnosis not present

## 2021-05-04 DIAGNOSIS — M62838 Other muscle spasm: Secondary | ICD-10-CM | POA: Diagnosis not present

## 2021-05-04 DIAGNOSIS — M9901 Segmental and somatic dysfunction of cervical region: Secondary | ICD-10-CM | POA: Diagnosis not present

## 2021-06-03 DIAGNOSIS — M9901 Segmental and somatic dysfunction of cervical region: Secondary | ICD-10-CM | POA: Diagnosis not present

## 2021-06-03 DIAGNOSIS — M62838 Other muscle spasm: Secondary | ICD-10-CM | POA: Diagnosis not present

## 2021-06-03 DIAGNOSIS — M5413 Radiculopathy, cervicothoracic region: Secondary | ICD-10-CM | POA: Diagnosis not present

## 2021-06-03 DIAGNOSIS — G44209 Tension-type headache, unspecified, not intractable: Secondary | ICD-10-CM | POA: Diagnosis not present

## 2021-07-01 DIAGNOSIS — M62838 Other muscle spasm: Secondary | ICD-10-CM | POA: Diagnosis not present

## 2021-07-01 DIAGNOSIS — G44209 Tension-type headache, unspecified, not intractable: Secondary | ICD-10-CM | POA: Diagnosis not present

## 2021-07-01 DIAGNOSIS — M9901 Segmental and somatic dysfunction of cervical region: Secondary | ICD-10-CM | POA: Diagnosis not present

## 2021-07-01 DIAGNOSIS — M5413 Radiculopathy, cervicothoracic region: Secondary | ICD-10-CM | POA: Diagnosis not present

## 2021-07-16 ENCOUNTER — Telehealth (INDEPENDENT_AMBULATORY_CARE_PROVIDER_SITE_OTHER): Payer: BC Managed Care – PPO | Admitting: Family Medicine

## 2021-07-16 DIAGNOSIS — J329 Chronic sinusitis, unspecified: Secondary | ICD-10-CM

## 2021-07-16 MED ORDER — AMOXICILLIN 500 MG PO CAPS: 1000.0000 mg | ORAL_CAPSULE | Freq: Two times a day (BID) | ORAL | 0 refills | Status: AC

## 2021-07-16 NOTE — Progress Notes (Signed)
    MyChart Video Visit    Virtual Visit via Video Note   This visit type was conducted due to national recommendations for restrictions regarding the COVID-19 Pandemic (e.g. social distancing) in an effort to limit this patient's exposure and mitigate transmission in our community. This patient is at least at moderate risk for complications without adequate follow up. This format is felt to be most appropriate for this patient at this time. Physical exam was limited by quality of the video and audio technology used for the visit.   Patient location: home Provider location: [facility]  I discussed the limitations of evaluation and management by telemedicine and the availability of in person appointments. The patient expressed understanding and agreed to proceed.  Patient: Laura Carey   DOB: [birth year]   35 y.o. Female  MRN: [id number] Visit Date: 07/16/2021  Today's healthcare provider: Mila Merry, MD   Chief Complaint  Patient presents with   Sinusitis    Subjective    Sinusitis This is a new problem. Episode onset: Started about 3 three ago. The problem has been gradually worsening since onset. The maximum temperature recorded prior to her arrival was 100.4 - 100.9 F. Associated symptoms include chills, congestion, coughing, headaches and sinus pressure. Pertinent negatives include no diaphoresis, ear pain, shortness of breath, sneezing or sore throat. The treatment provided no relief.      Medications: No outpatient medications prior to visit.   No facility-administered medications prior to visit.    Review of Systems  Constitutional:  Positive for chills, fatigue and fever (101.3). Negative for activity change, appetite change, diaphoresis and unexpected weight change.  HENT:  Positive for congestion, sinus pressure and sinus pain. Negative for ear discharge, ear pain, hearing loss, sneezing, sore throat, tinnitus and trouble swallowing.   Eyes: Negative.   Respiratory:   Positive for cough. Negative for apnea, choking, chest tightness, shortness of breath, wheezing and stridor.   Gastrointestinal: Negative.   Neurological:  Positive for headaches. Negative for dizziness and light-headedness.     Objective    There were no vitals taken for this visit.  Physical Exam  Awake, alert, oriented x 3. In no apparent distress    Assessment & Plan     1. Sinusitis, unspecified chronicity, unspecified location  - amoxicillin (AMOXIL) 500 MG capsule; Take 2 capsules (1,000 mg total) by mouth 2 (two) times daily for 10 days.  Dispense: 40 capsule; Refill: 0    Call if symptoms change or if not rapidly improving.      I discussed the assessment and treatment plan with the patient. The patient was provided an opportunity to ask questions and all were answered. The patient agreed with the plan and demonstrated an understanding of the instructions.   The patient was advised to call back or seek an in-person evaluation if the symptoms worsen or if the condition fails to improve as anticipated.  I provided 10 minutes of non-face-to-face time during this encounter.  The entirety of the information documented in the History of Present Illness, Review of Systems and Physical Exam were personally obtained by me. Portions of this information were initially documented by the CMA and reviewed by me for thoroughness and accuracy.    Mila Merry, MD [facility] [phone] (phone) [phone] (fax)  [facility]

## 2021-07-29 DIAGNOSIS — G44209 Tension-type headache, unspecified, not intractable: Secondary | ICD-10-CM | POA: Diagnosis not present

## 2021-07-29 DIAGNOSIS — M9901 Segmental and somatic dysfunction of cervical region: Secondary | ICD-10-CM | POA: Diagnosis not present

## 2021-07-29 DIAGNOSIS — M5413 Radiculopathy, cervicothoracic region: Secondary | ICD-10-CM | POA: Diagnosis not present

## 2021-07-29 DIAGNOSIS — M62838 Other muscle spasm: Secondary | ICD-10-CM | POA: Diagnosis not present

## 2021-08-26 ENCOUNTER — Encounter: Payer: Self-pay | Admitting: Family Medicine

## 2021-08-26 ENCOUNTER — Other Ambulatory Visit: Payer: Self-pay

## 2021-08-26 ENCOUNTER — Ambulatory Visit (INDEPENDENT_AMBULATORY_CARE_PROVIDER_SITE_OTHER): Payer: BC Managed Care – PPO | Admitting: Family Medicine

## 2021-08-26 VITALS — BP 102/73 | HR 69 | Resp 14 | Ht 66.0 in | Wt 154.5 lb

## 2021-08-26 DIAGNOSIS — N939 Abnormal uterine and vaginal bleeding, unspecified: Secondary | ICD-10-CM

## 2021-08-26 DIAGNOSIS — J329 Chronic sinusitis, unspecified: Secondary | ICD-10-CM | POA: Insufficient documentation

## 2021-08-26 DIAGNOSIS — E559 Vitamin D deficiency, unspecified: Secondary | ICD-10-CM | POA: Insufficient documentation

## 2021-08-26 DIAGNOSIS — Z13220 Encounter for screening for lipoid disorders: Secondary | ICD-10-CM | POA: Diagnosis not present

## 2021-08-26 DIAGNOSIS — N923 Ovulation bleeding: Secondary | ICD-10-CM

## 2021-08-26 DIAGNOSIS — Z8709 Personal history of other diseases of the respiratory system: Secondary | ICD-10-CM | POA: Diagnosis not present

## 2021-08-26 DIAGNOSIS — Z0000 Encounter for general adult medical examination without abnormal findings: Secondary | ICD-10-CM

## 2021-08-26 DIAGNOSIS — Z131 Encounter for screening for diabetes mellitus: Secondary | ICD-10-CM | POA: Diagnosis not present

## 2021-08-26 DIAGNOSIS — R5382 Chronic fatigue, unspecified: Secondary | ICD-10-CM | POA: Diagnosis not present

## 2021-08-26 DIAGNOSIS — Z1159 Encounter for screening for other viral diseases: Secondary | ICD-10-CM | POA: Diagnosis not present

## 2021-08-26 NOTE — Assessment & Plan Note (Signed)
UTD on dental and eye exams Things to do to keep yourself healthy  - Exercise at least 30-45 minutes a day, 3-4 days a week.  - Eat a low-fat diet with lots of fruits and vegetables, up to 7-9 servings per day.  - Seatbelts can save your life. Wear them always.  - Smoke detectors on every level of your home, check batteries every year.  - Eye Doctor - have an eye exam every 1-2 years  - Safe sex - if you may be exposed to STDs, use a condom.  - Alcohol -  If you drink, do it moderately, less than 2 drinks per day.  - Health Care Power of Attorney. Choose someone to speak for you if you are not able.  - Depression is common in our stressful world.If you're feeling down or losing interest in things you normally enjoy, please come in for a visit.  - Violence - If anyone is threatening or hurting you, please call immediately.   

## 2021-08-26 NOTE — Assessment & Plan Note (Signed)
Large clots, between cycles  Irregular F/b GYN

## 2021-08-26 NOTE — Assessment & Plan Note (Signed)
Low risk screen Lab pending

## 2021-08-26 NOTE — Assessment & Plan Note (Signed)
Screening lipid panel

## 2021-08-26 NOTE — Assessment & Plan Note (Signed)
Reports chronic congestion -use of oral antihistamines -encouraged daily use of nasal steroids -may need allergy testings -reports episodes lasting >3 weeks each exacerbation Request referral to ENT No abnormalities noted or seen on exam

## 2021-08-26 NOTE — Assessment & Plan Note (Signed)
Last seen for sinus complaints in 12/22; resolved Pt request for ENT referral at this time

## 2021-08-26 NOTE — Progress Notes (Signed)
Complete physical exam   Patient: Laura Carey   DOB: 1986/01/21   36 y.o. Female  MRN: [id number] Visit Date: 08/26/2021  Today's healthcare provider: Gwyneth Sprout, FNP   Chief Complaint  Patient presents with   Annual Exam   Subjective     HPI  Laura Carey is a 36 y.o. female who presents today for a complete physical exam.  She reports consuming a general diet. The patient does not participate in regular exercise at present. She generally feels fairly well. She reports sleeping poorly, patient reports difficulty staying asleep. She does have additional problems to discuss today, patient would like to address fatigue that she has been .   Last Reported Pap-04/18/2018  History reviewed. No pertinent past medical history. Past Surgical History:  Procedure Laterality Date   HYSTEROSCOPY  01/2017   Social History   Socioeconomic History   Marital status: Married    Spouse name: Not on file   Number of children: Not on file   Years of education: Not on file   Highest education level: Not on file  Occupational History   Not on file  Tobacco Use   Smoking status: Never   Smokeless tobacco: Never  Vaping Use   Vaping Use: Never used  Substance and Sexual Activity   Alcohol use: Yes    Comment: social   Drug use: No   Sexual activity: Yes    Birth control/protection: None  Other Topics Concern   Not on file  Social History Narrative   Not on file   Social Determinants of Health   Financial Resource Strain: Not on file  Food Insecurity: Not on file  Transportation Needs: Not on file  Physical Activity: Not on file  Stress: Not on file  Social Connections: Not on file  Intimate Partner Violence: Not on file   Family Status  Relation Name Status   Mother  Alive   Father  Alive   MGM  Deceased   MGF  Deceased   PGM  Deceased   PGF  Deceased   Family History  Problem Relation Age of Onset   Cancer Maternal Grandmother        breast   No Known  Allergies  Patient Care Team: Gwyneth Sprout, FNP as PCP - General (Family Medicine)   Medications: No outpatient medications prior to visit.   No facility-administered medications prior to visit.    Review of Systems  Constitutional:  Positive for fatigue.  HENT:  Positive for congestion and sinus pressure.   Eyes:  Positive for itching.  Musculoskeletal:  Positive for neck pain.  Neurological:  Positive for headaches.  All other systems reviewed and are negative.    Objective    BP 102/73    Pulse 69    Resp 14    Ht '5\' 6"'  (1.676 m)    Wt 154 lb 8 oz (70.1 kg)    LMP 08/08/2021 (Exact Date)    SpO2 100%    BMI 24.94 kg/m    Physical Exam Vitals and nursing note reviewed.  Constitutional:      General: She is awake. She is not in acute distress.    Appearance: Normal appearance. She is well-developed, well-groomed and normal weight. She is not ill-appearing, toxic-appearing or diaphoretic.  HENT:     Head: Normocephalic and atraumatic.     Jaw: There is normal jaw occlusion. No trismus, tenderness, swelling or pain on movement.  Right Ear: Hearing, tympanic membrane, ear canal and external ear normal. There is no impacted cerumen.     Left Ear: Hearing, tympanic membrane, ear canal and external ear normal. There is no impacted cerumen.     Nose: Nose normal. No congestion or rhinorrhea.     Right Turbinates: Not enlarged, swollen or pale.     Left Turbinates: Not enlarged, swollen or pale.     Right Sinus: No maxillary sinus tenderness or frontal sinus tenderness.     Left Sinus: No maxillary sinus tenderness or frontal sinus tenderness.     Mouth/Throat:     Lips: Pink.     Mouth: Mucous membranes are moist. No injury.     Tongue: No lesions.     Pharynx: Oropharynx is clear. Uvula midline. No pharyngeal swelling, oropharyngeal exudate, posterior oropharyngeal erythema or uvula swelling.     Tonsils: No tonsillar exudate or tonsillar abscesses.  Eyes:     General:  Lids are normal. Lids are everted, no foreign bodies appreciated. Vision grossly intact. Gaze aligned appropriately. No allergic shiner or visual field deficit.       Right eye: No discharge.        Left eye: No discharge.     Extraocular Movements: Extraocular movements intact.     Conjunctiva/sclera: Conjunctivae normal.     Right eye: Right conjunctiva is not injected. No exudate.    Left eye: Left conjunctiva is not injected. No exudate.    Pupils: Pupils are equal, round, and reactive to light.  Neck:     Thyroid: No thyroid mass, thyromegaly or thyroid tenderness.     Vascular: No carotid bruit.     Trachea: Trachea normal.  Cardiovascular:     Rate and Rhythm: Normal rate and regular rhythm.     Pulses: Normal pulses.          Carotid pulses are 2+ on the right side and 2+ on the left side.      Radial pulses are 2+ on the right side and 2+ on the left side.       Dorsalis pedis pulses are 2+ on the right side and 2+ on the left side.       Posterior tibial pulses are 2+ on the right side and 2+ on the left side.     Heart sounds: Normal heart sounds, S1 normal and S2 normal. No murmur heard.   No friction rub. No gallop.  Pulmonary:     Effort: Pulmonary effort is normal. No respiratory distress.     Breath sounds: Normal breath sounds and air entry. No stridor. No wheezing, rhonchi or rales.  Chest:     Chest wall: No tenderness.     Comments: Breasts: breasts appear normal, no suspicious masses, no skin or nipple changes or axillary nodes, symmetric fibrous changes in both upper outer quadrants, right breast normal without mass, skin or nipple changes or axillary nodes, left breast normal without mass, skin or nipple changes or axillary nodes, risk and benefit of breast self-exam was discussed  Abdominal:     General: Abdomen is flat. Bowel sounds are normal. There is no distension.     Palpations: Abdomen is soft. There is no mass.     Tenderness: There is no abdominal  tenderness. There is no right CVA tenderness, left CVA tenderness, guarding or rebound.     Hernia: No hernia is present.  Genitourinary:    Comments: Exam deferred; denies complaints Musculoskeletal:  General: No swelling, tenderness, deformity or signs of injury. Normal range of motion.     Cervical back: Full passive range of motion without pain, normal range of motion and neck supple. No edema, rigidity or tenderness. No muscular tenderness.     Right lower leg: No edema.     Left lower leg: No edema.  Lymphadenopathy:     Cervical: No cervical adenopathy.     Right cervical: No superficial, deep or posterior cervical adenopathy.    Left cervical: No superficial, deep or posterior cervical adenopathy.  Skin:    General: Skin is warm and dry.     Capillary Refill: Capillary refill takes less than 2 seconds.     Coloration: Skin is not jaundiced or pale.     Findings: No bruising, erythema, lesion or rash.  Neurological:     General: No focal deficit present.     Mental Status: She is alert and oriented to person, place, and time. Mental status is at baseline.     GCS: GCS eye subscore is 4. GCS verbal subscore is 5. GCS motor subscore is 6.     Sensory: Sensation is intact. No sensory deficit.     Motor: Motor function is intact. No weakness.     Coordination: Coordination is intact. Coordination normal.     Gait: Gait is intact. Gait normal.  Psychiatric:        Attention and Perception: Attention and perception normal.        Mood and Affect: Mood and affect normal.        Speech: Speech normal.        Behavior: Behavior normal. Behavior is cooperative.        Thought Content: Thought content normal.        Cognition and Memory: Cognition and memory normal.        Judgment: Judgment normal.    Last depression screening scores PHQ 2/9 Scores 08/26/2021 07/03/2020 05/08/2020  PHQ - 2 Score '1 1 1  ' PHQ- 9 Score '5 3 2   ' Last fall risk screening Fall Risk  07/03/2020   Falls in the past year? 0  Number falls in past yr: 0  Injury with Fall? 0   Last Audit-C alcohol use screening Alcohol Use Disorder Test (AUDIT) 05/08/2020  1. How often do you have a drink containing alcohol? 2  2. How many drinks containing alcohol do you have on a typical day when you are drinking? 0  3. How often do you have six or more drinks on one occasion? 0  AUDIT-C Score 2   A score of 3 or more in women, and 4 or more in men indicates increased risk for alcohol abuse, EXCEPT if all of the points are from question 1   No results found for any visits on 08/26/21.  Assessment & Plan    Routine Health Maintenance and Physical Exam  Exercise Activities and Dietary recommendations  Goals   None     Immunization History  Administered Date(s) Administered   MMR 08/12/2013   PFIZER(Purple Top)SARS-COV-2 Vaccination 04/23/2020, 05/14/2020   Tdap 01/05/2018    Health Maintenance  Topic Date Due   Hepatitis C Screening  Never done   COVID-19 Vaccine (3 - Pfizer risk series) 09/11/2021 (Originally 06/11/2020)   INFLUENZA VACCINE  11/13/2021 (Originally 03/15/2021)   PAP SMEAR-Modifier  08/22/2023   TETANUS/TDAP  01/06/2028   HIV Screening  Completed   HPV VACCINES  Aged Out   Pneumococcal Vaccine  57-31 Years old  Discontinued    Discussed health benefits of physical activity, and encouraged her to engage in regular exercise appropriate for her age and condition.  Patient seen and examined by Tally Joe,  FNP note scribed by Jennings Books, [place]   Return in about 1 year (around 08/26/2022) for annual examination.    Problem List Items Addressed This Visit       Respiratory   Chronic congestion of paranasal sinus    Reports chronic congestion -use of oral antihistamines -encouraged daily use of nasal steroids -may need allergy testings -reports episodes lasting >3 weeks each exacerbation Request referral to ENT No abnormalities noted or seen on exam        Relevant Orders   Ambulatory referral to ENT     Other   Fatigue    Occasional sleep less nights- where she wakes mid morning around 3/4 am and can't fall back asleep Reports fatigue Some days wants to take nap after being up 3-4 hours Denies depression      History of sinusitis    Last seen for sinus complaints in 12/22; resolved Pt request for ENT referral at this time      Relevant Orders   Ambulatory referral to ENT   Vaginal spotting    Large clots, between cycles  Irregular F/b GYN      Relevant Orders   CBC with Differential/Platelet   Encounter for hepatitis C screening test for low risk patient    Low risk screen Lab pending      Relevant Orders   Hepatitis C Antibody   Annual physical exam - Primary    UTD on dental and eye exams Things to do to keep yourself healthy  - Exercise at least 30-45 minutes a day, 3-4 days a week.  - Eat a low-fat diet with lots of fruits and vegetables, up to 7-9 servings per day.  - Seatbelts can save your life. Wear them always.  - Smoke detectors on every level of your home, check batteries every year.  - Eye Doctor - have an eye exam every 1-2 years  - Safe sex - if you may be exposed to STDs, use a condom.  - Alcohol -  If you drink, do it moderately, less than 2 drinks per day.  - Concrete. Choose someone to speak for you if you are not able.  - Depression is common in our stressful world.If you're feeling down or losing interest in things you normally enjoy, please come in for a visit.  - Violence - If anyone is threatening or hurting you, please call immediately.        Relevant Orders   Comprehensive metabolic panel   Screening for diabetes mellitus    35- A1c screen based on age      Relevant Orders   Hemoglobin A1c   Screening, lipid    Screening lipid panel      Relevant Orders   Lipid panel   Avitaminosis D    Taking OTC supplement; low dose Repeat blood work to check levels  today given hx of fatigue      Relevant Orders   VITAMIN D 25 Hydroxy (Vit-D Deficiency, Fractures)   RESOLVED: Spotting between menses    Patient seen and examined by Tally Joe,  FNP note scribed by Jennings Books, NCMA   I, Gwyneth Sprout, FNP, have reviewed all documentation for this visit. The documentation on 08/26/21 for the exam,  diagnosis, procedures, and orders are all accurate and complete.  Gwyneth Sprout, [place] [phone] (phone) [phone] (fax)  [place]

## 2021-08-26 NOTE — Assessment & Plan Note (Signed)
35- A1c screen based on age

## 2021-08-26 NOTE — Assessment & Plan Note (Signed)
Taking OTC supplement; low dose Repeat blood work to check levels today given hx of fatigue

## 2021-08-26 NOTE — Assessment & Plan Note (Signed)
Occasional sleep less nights- where she wakes mid morning around 3/4 am and can't fall back asleep Reports fatigue Some days wants to take nap after being up 3-4 hours Denies depression

## 2021-08-27 ENCOUNTER — Encounter: Payer: BC Managed Care – PPO | Admitting: Certified Nurse Midwife

## 2021-08-27 LAB — HEMOGLOBIN A1C
Est. average glucose Bld gHb Est-mCnc: 108 mg/dL
Hgb A1c MFr Bld: 5.4 % (ref 4.8–5.6)

## 2021-08-27 LAB — CBC WITH DIFFERENTIAL/PLATELET
Basophils Absolute: 0.1 10*3/uL (ref 0.0–0.2)
Basos: 1 %
EOS (ABSOLUTE): 0.1 10*3/uL (ref 0.0–0.4)
Eos: 2 %
Hematocrit: 38.4 % (ref 34.0–46.6)
Hemoglobin: 11.8 g/dL (ref 11.1–15.9)
Immature Grans (Abs): 0.1 10*3/uL (ref 0.0–0.1)
Immature Granulocytes: 1 %
Lymphocytes Absolute: 2.2 10*3/uL (ref 0.7–3.1)
Lymphs: 30 %
MCH: 26.9 pg (ref 26.6–33.0)
MCHC: 30.7 g/dL — ABNORMAL LOW (ref 31.5–35.7)
MCV: 88 fL (ref 79–97)
Monocytes Absolute: 0.5 10*3/uL (ref 0.1–0.9)
Monocytes: 6 %
Neutrophils Absolute: 4.5 10*3/uL (ref 1.4–7.0)
Neutrophils: 60 %
Platelets: 325 10*3/uL (ref 150–450)
RBC: 4.38 x10E6/uL (ref 3.77–5.28)
RDW: 13.5 % (ref 11.7–15.4)
WBC: 7.5 10*3/uL (ref 3.4–10.8)

## 2021-08-27 LAB — LIPID PANEL
Chol/HDL Ratio: 2.8 ratio (ref 0.0–4.4)
Cholesterol, Total: 162 mg/dL (ref 100–199)
HDL: 57 mg/dL (ref >39)
LDL Chol Calc (NIH): 90 mg/dL (ref 0–99)
Triglycerides: 77 mg/dL (ref 0–149)
VLDL Cholesterol Cal: 15 mg/dL (ref 5–40)

## 2021-08-27 LAB — COMPREHENSIVE METABOLIC PANEL
ALT: 8 IU/L (ref 0–32)
AST: 13 IU/L (ref 0–40)
Albumin/Globulin Ratio: 2.3 — ABNORMAL HIGH (ref 1.2–2.2)
Albumin: 4.9 g/dL — ABNORMAL HIGH (ref 3.8–4.8)
Alkaline Phosphatase: 57 IU/L (ref 44–121)
BUN/Creatinine Ratio: 16 (ref 9–23)
BUN: 12 mg/dL (ref 6–20)
Bilirubin Total: 1.2 mg/dL (ref 0.0–1.2)
CO2: 26 mmol/L (ref 20–29)
Calcium: 9.4 mg/dL (ref 8.7–10.2)
Chloride: 103 mmol/L (ref 96–106)
Creatinine, Ser: 0.74 mg/dL (ref 0.57–1.00)
Globulin, Total: 2.1 g/dL (ref 1.5–4.5)
Glucose: 83 mg/dL (ref 70–99)
Potassium: 4 mmol/L (ref 3.5–5.2)
Sodium: 142 mmol/L (ref 134–144)
Total Protein: 7 g/dL (ref 6.0–8.5)
eGFR: 108 mL/min/{1.73_m2} (ref >59)

## 2021-08-27 LAB — HEPATITIS C ANTIBODY: Hep C Virus Ab: <0.1 s/co ratio (ref 0.0–0.9)

## 2021-08-27 LAB — VITAMIN D 25 HYDROXY (VIT D DEFICIENCY, FRACTURES): Vit D, 25-Hydroxy: 41.7 ng/mL (ref 30.0–100.0)

## 2021-09-03 ENCOUNTER — Other Ambulatory Visit: Payer: Self-pay

## 2021-09-03 ENCOUNTER — Encounter: Payer: Self-pay | Admitting: Obstetrics and Gynecology

## 2021-09-03 ENCOUNTER — Ambulatory Visit (INDEPENDENT_AMBULATORY_CARE_PROVIDER_SITE_OTHER): Payer: BC Managed Care – PPO | Admitting: Obstetrics and Gynecology

## 2021-09-03 VITALS — BP 111/72 | HR 66 | Ht 66.0 in | Wt 154.0 lb

## 2021-09-03 DIAGNOSIS — Z01419 Encounter for gynecological examination (general) (routine) without abnormal findings: Secondary | ICD-10-CM | POA: Diagnosis not present

## 2021-09-03 DIAGNOSIS — R5383 Other fatigue: Secondary | ICD-10-CM | POA: Diagnosis not present

## 2021-09-03 DIAGNOSIS — Q5128 Other doubling of uterus, other specified: Secondary | ICD-10-CM | POA: Diagnosis not present

## 2021-09-03 NOTE — Addendum Note (Signed)
Addended by: Fabian November on: 09/03/2021 02:39 PM   Modules accepted: Orders

## 2021-09-03 NOTE — Progress Notes (Signed)
GYNECOLOGY ANNUAL PHYSICAL EXAM PROGRESS NOTE  Subjective:    Laura Carey is a 36 y.o. G73P1021 female who presents for an annual exam. She has a history of septate uterus. The patient has no complaints today. The patient is sexually active. The patient participates in regular exercise:  occasionally . Has the patient ever been transfused or tattooed?: no. The patient reports that there is not domestic violence in her life.   Does feel some fatigue lately. Has been going on for a while but noticing it is worsened lately.  Recently had labs checked for anemia due to h/o heavier cycles with clotting, but was normal.   Menstrual History: Menarche age: cannot recall but was early teen Patient's last menstrual period was 08/09/2021 (exact date). Period Cycle (Days): 28 Period Duration (Days): 7 Period Pattern: Regular Menstrual Flow: Light, Moderate Menstrual Control: Maxi pad, Tampon, Other (Comment) (Diva cup) Menstrual Control Change Freq (Hours): 2-3 Dysmenorrhea: (!) Mild Dysmenorrhea Symptoms: Cramping   Gynecologic History:  Contraception: rhythm method History of STI's: Denies Last Pap: 08/21/2020. Results were: normal.  Denies h/o abnormal pap smears.      Upstream - 09/03/21 1416       Pregnancy Intention Screening   Does the patient want to become pregnant in the next year? No    Does the patient's partner want to become pregnant in the next year? No    Would the patient like to discuss contraceptive options today? No      Contraception Wrap Up   Current Method FAM or LAM    End Method FAM or LAM    Contraception Counseling Provided No            The pregnancy intention screening data noted above was reviewed. Potential methods of contraception were discussed. The patient elected to proceed with FAM or LAM.   OB History  Gravida Para Term Preterm AB Living  3 1 1  0 2 1  SAB IAB Ectopic Multiple Live Births  2 0 0 0 1    # Outcome Date GA Lbr Len/2nd  Weight Sex Delivery Anes PTL Lv  3 Term 03/17/18 [redacted]w[redacted]d  7 lb 15 oz (3.6 kg) F Vag-Spont EPI N LIV  2 SAB 2017 [redacted]w[redacted]d         1 SAB 2016 [redacted]w[redacted]d           History reviewed. No pertinent past medical history.  Past Surgical History:  Procedure Laterality Date   HYSTEROSCOPY  01/2017    Family History  Problem Relation Age of Onset   Cancer Maternal Grandmother        breast    Social History   Socioeconomic History   Marital status: Married    Spouse name: Not on file   Number of children: Not on file   Years of education: Not on file   Highest education level: Not on file  Occupational History   Not on file  Tobacco Use   Smoking status: Never   Smokeless tobacco: Never  Vaping Use   Vaping Use: Never used  Substance and Sexual Activity   Alcohol use: Yes    Comment: social   Drug use: No   Sexual activity: Yes    Birth control/protection: None  Other Topics Concern   Not on file  Social History Narrative   Not on file   Social Determinants of Health   Financial Resource Strain: Not on file  Food Insecurity: Not on file  Transportation  Needs: Not on file  Physical Activity: Not on file  Stress: Not on file  Social Connections: Not on file  Intimate Partner Violence: Not on file    No current outpatient medications on file prior to visit.   No current facility-administered medications on file prior to visit.    No Known Allergies   Review of Systems Constitutional: negative for chills, fatigue, fevers and sweats Eyes: negative for irritation, redness and visual disturbance Ears, nose, mouth, throat, and face: negative for hearing loss, nasal congestion, snoring and tinnitus Respiratory: negative for asthma, cough, sputum Cardiovascular: negative for chest pain, dyspnea, exertional chest pressure/discomfort, irregular heart beat, palpitations and syncope Gastrointestinal: negative for abdominal pain, change in bowel habits, nausea and  vomiting Genitourinary: negative for abnormal menstrual periods, genital lesions, sexual problems and vaginal discharge, dysuria and urinary incontinence Integument/breast: negative for breast lump, breast tenderness and nipple discharge Hematologic/lymphatic: negative for bleeding and easy bruising Musculoskeletal:negative for back pain and muscle weakness Neurological: negative for dizziness, headaches, vertigo and weakness Endocrine: negative for diabetic symptoms including polydipsia, polyuria and skin dryness Allergic/Immunologic: negative for hay fever and urticaria      Objective:  Blood pressure 111/72, pulse 66, height 5\' 6"  (1.676 m), weight 154 lb (69.9 kg), last menstrual period 08/09/2021. Body mass index is 24.86 kg/m.    General Appearance:    Alert, cooperative, no distress, appears stated age  Head:    Normocephalic, without obvious abnormality, atraumatic  Eyes:    PERRL, conjunctiva/corneas clear, EOM's intact, both eyes  Ears:    Normal external ear canals, both ears  Nose:   Nares normal, septum midline, mucosa normal, no drainage or sinus tenderness  Throat:   Lips, mucosa, and tongue normal; teeth and gums normal  Neck:   Supple, symmetrical, trachea midline, no adenopathy; thyroid: no enlargement/tenderness/nodules; no carotid bruit or JVD  Back:     Symmetric, no curvature, ROM normal, no CVA tenderness  Lungs:     Clear to auscultation bilaterally, respirations unlabored  Chest Wall:    No tenderness or deformity   Heart:    Regular rate and rhythm, S1 and S2 normal, no murmur, rub or gallop  Breast Exam:    No tenderness, masses, or nipple abnormality  Abdomen:     Soft, non-tender, bowel sounds active all four quadrants, no masses, no organomegaly.    Genitalia:    Pelvic:external genitalia normal, vagina without lesions, discharge, or tenderness, rectovaginal septum  normal. Cervix normal in appearance, no cervical motion tenderness, no adnexal masses or  tenderness.  Uterus normal size, shape, mobile, regular contours, nontender.  Rectal:    Normal external sphincter.  No hemorrhoids appreciated. Internal exam not done.   Extremities:   Extremities normal, atraumatic, no cyanosis or edema  Pulses:   2+ and symmetric all extremities  Skin:   Skin color, texture, turgor normal, no rashes or lesions  Lymph nodes:   Cervical, supraclavicular, and axillary nodes normal  Neurologic:   CNII-XII intact, normal strength, sensation and reflexes throughout   .  Labs:  Lab Results  Component Value Date   WBC 7.5 08/26/2021   HGB 11.8 08/26/2021   HCT 38.4 08/26/2021   MCV 88 08/26/2021   PLT 325 08/26/2021    Lab Results  Component Value Date   CREATININE 0.74 08/26/2021   BUN 12 08/26/2021   NA 142 08/26/2021   K 4.0 08/26/2021   CL 103 08/26/2021   CO2 26 08/26/2021  Lab Results  Component Value Date   ALT 8 08/26/2021   AST 13 08/26/2021   ALKPHOS 57 08/26/2021   BILITOT 1.2 08/26/2021    Lab Results  Component Value Date   TSH 1.200 05/19/2020    Lab Results  Component Value Date   VD25OH 41.7 08/26/2021     Assessment:   1. Well woman exam with routine gynecological exam   2. Uterus, septate   3. Fatigue, unspecified type      Plan:  Blood tests: TSH and Vitamin B12 for h/o fatigue. Breast self exam technique reviewed and patient encouraged to perform self-exam monthly. Contraception: rhythm method. Discussed healthy lifestyle modifications. Advised that improving diet and engaging in physical activity could also be helpful with fatigue.  Mammogram  to begin screening at age 31. Pap smear  up to date . COVID vaccination status: has completed Pfizer series, declines booster.  Flu vaccine: declines Septate uterus, no major concerns at this time.  Follow up in 1 year for annual exam   Rubie Maid, MD [facility]

## 2021-09-04 LAB — VITAMIN B12: Vitamin B-12: 293 pg/mL (ref 232–1245)

## 2021-09-04 LAB — TSH: TSH: 1.12 u[IU]/mL (ref 0.450–4.500)

## 2021-10-15 DIAGNOSIS — J301 Allergic rhinitis due to pollen: Secondary | ICD-10-CM | POA: Diagnosis not present

## 2021-10-15 DIAGNOSIS — J3489 Other specified disorders of nose and nasal sinuses: Secondary | ICD-10-CM | POA: Diagnosis not present

## 2021-10-15 DIAGNOSIS — J342 Deviated nasal septum: Secondary | ICD-10-CM | POA: Diagnosis not present

## 2022-01-20 DIAGNOSIS — J3489 Other specified disorders of nose and nasal sinuses: Secondary | ICD-10-CM | POA: Diagnosis not present

## 2022-01-20 DIAGNOSIS — J342 Deviated nasal septum: Secondary | ICD-10-CM | POA: Diagnosis not present

## 2022-02-24 IMAGING — US US ABDOMEN LIMITED
1 series · 14 of 25 positions shown · non-contrast
Comparison: None.

CLINICAL DATA: Right upper quadrant abdominal pain.

EXAM:
ULTRASOUND ABDOMEN LIMITED RIGHT UPPER QUADRANT

[Series 1: us abdomen limited · 0.22mm/px · 14 of 46 slices shown]
[im 1/46]
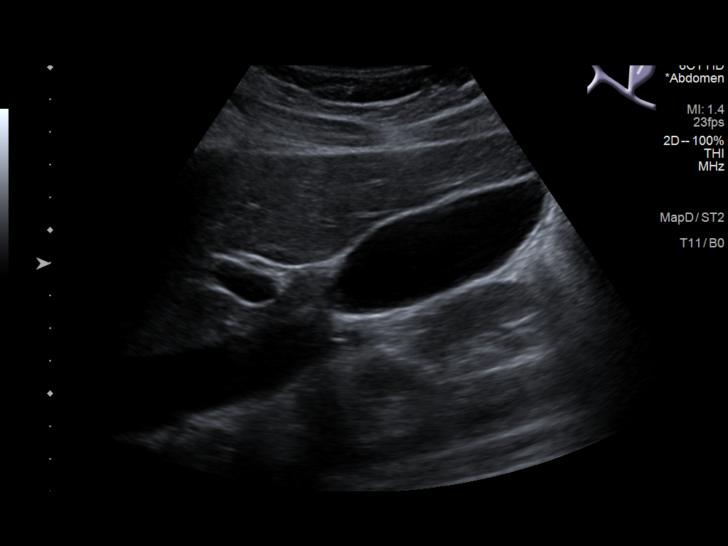
[im 4/46]
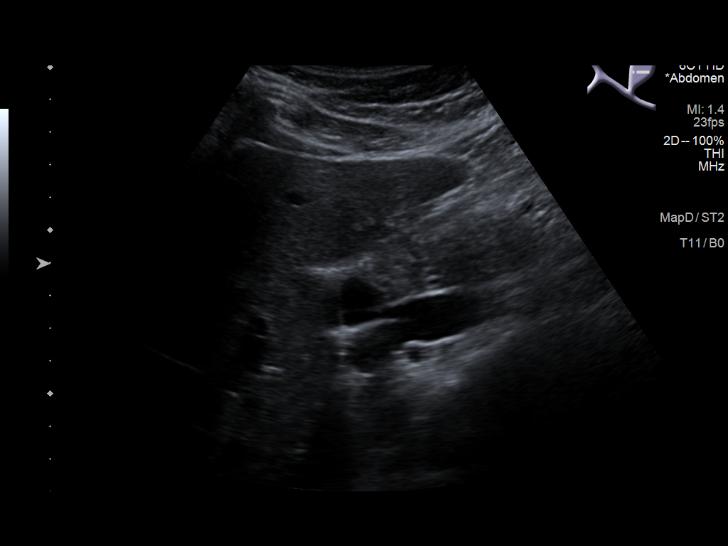
[im 8/46]
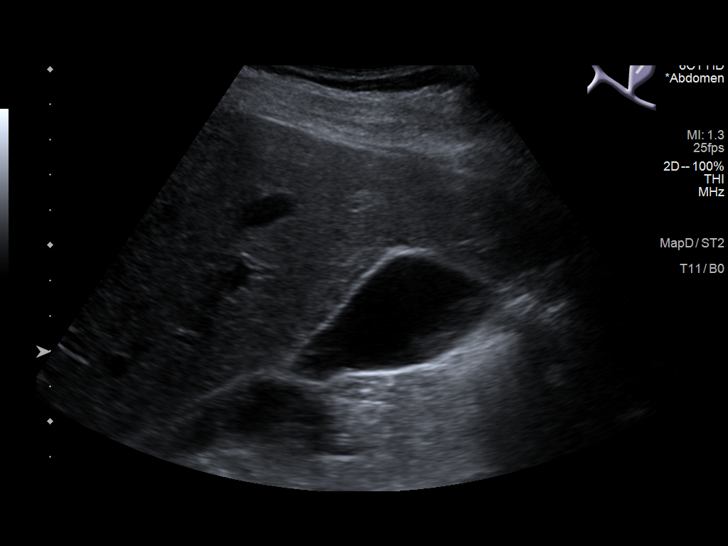
[im 12/46]
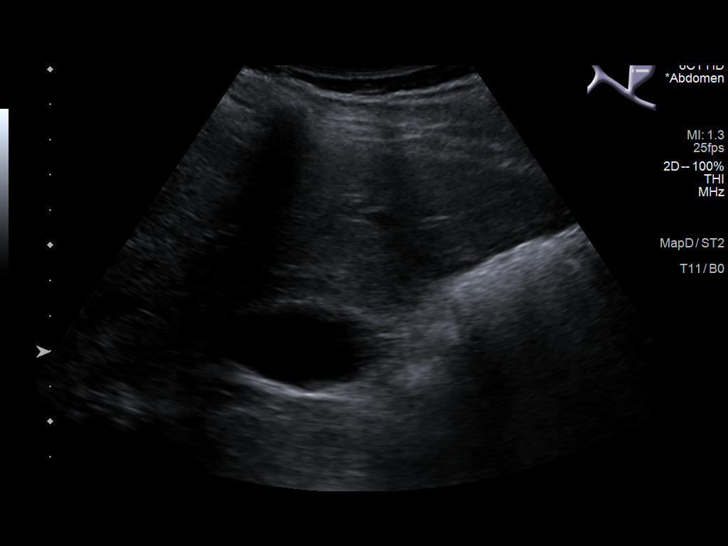
[im 16/46]
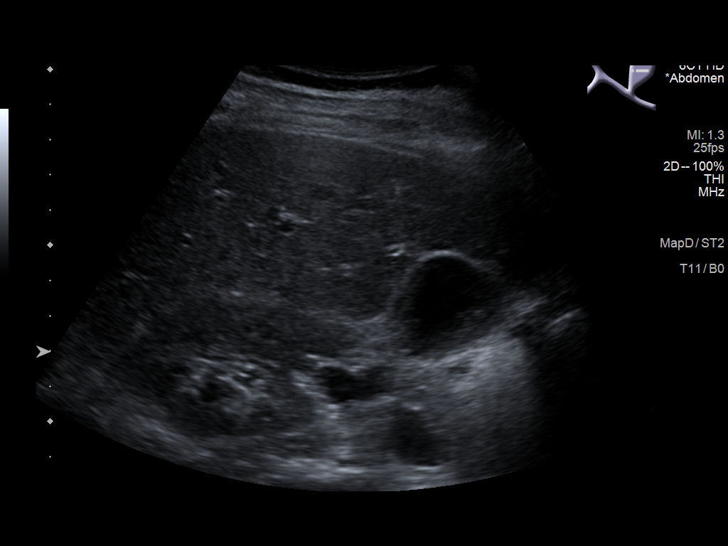
[im 17/46]
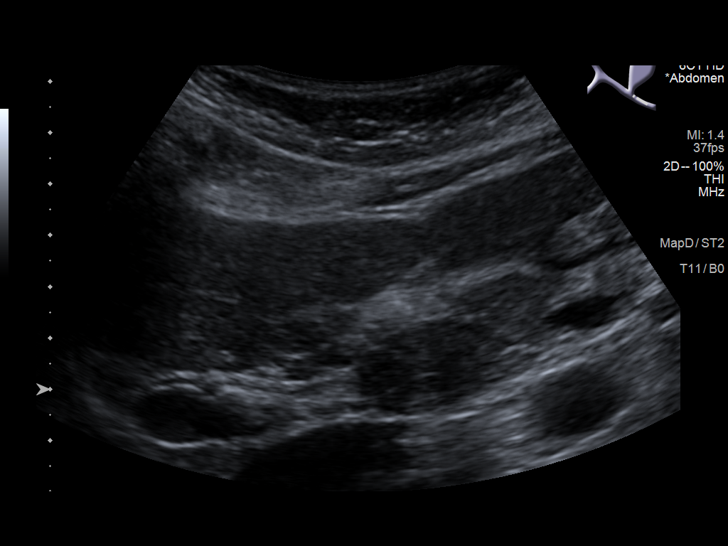
[im 21/46]
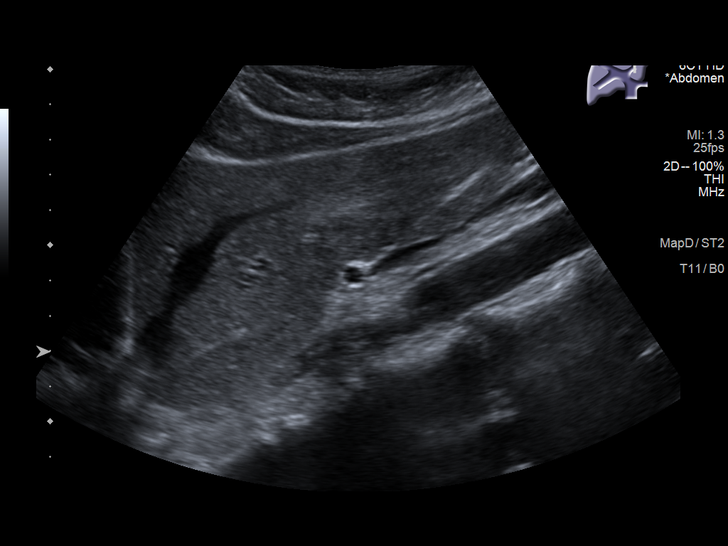
[im 25/46]
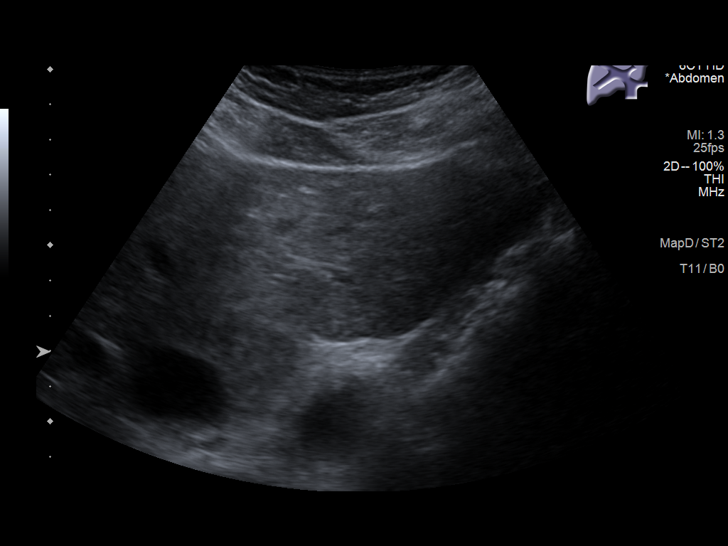
[im 29/46]
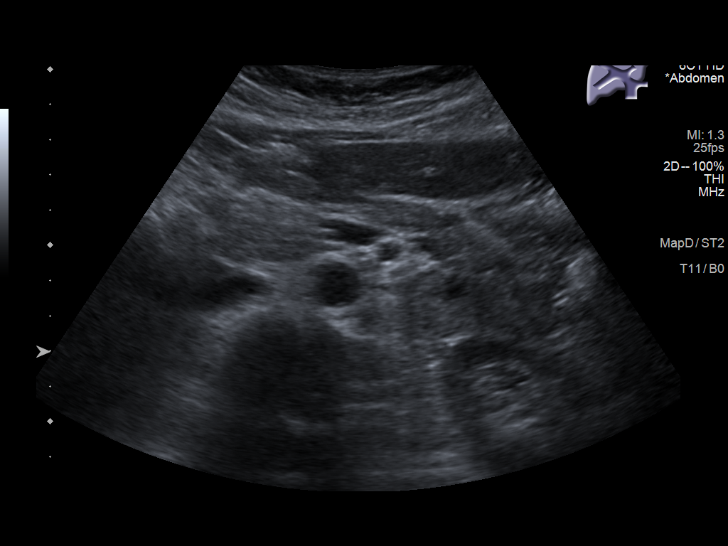
[im 31/46]
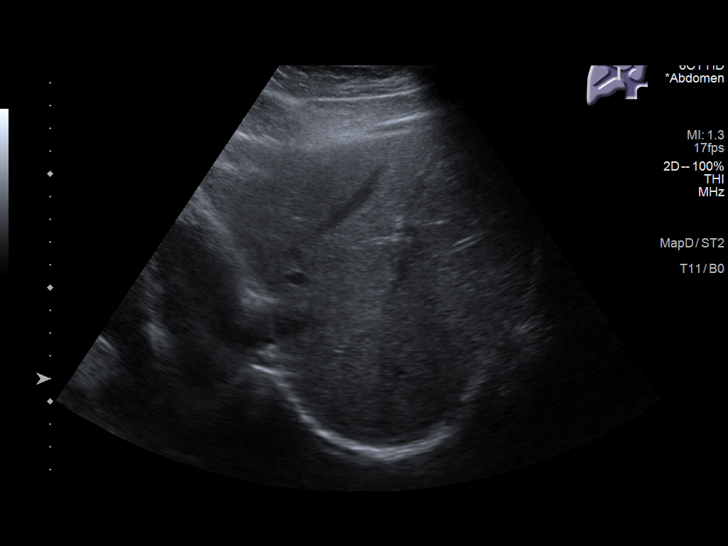
[im 34/46]
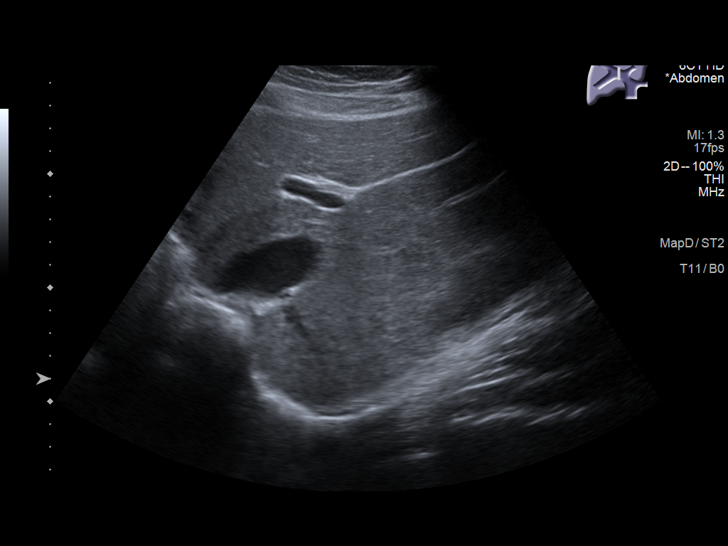
[im 38/46]
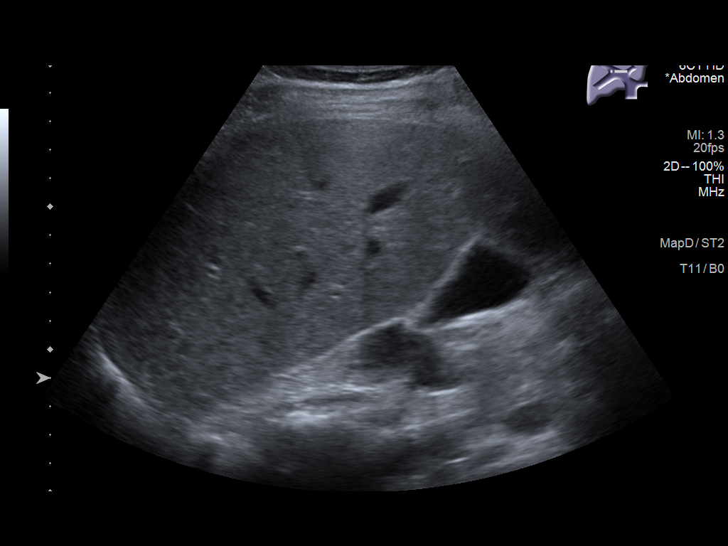
[im 42/46]
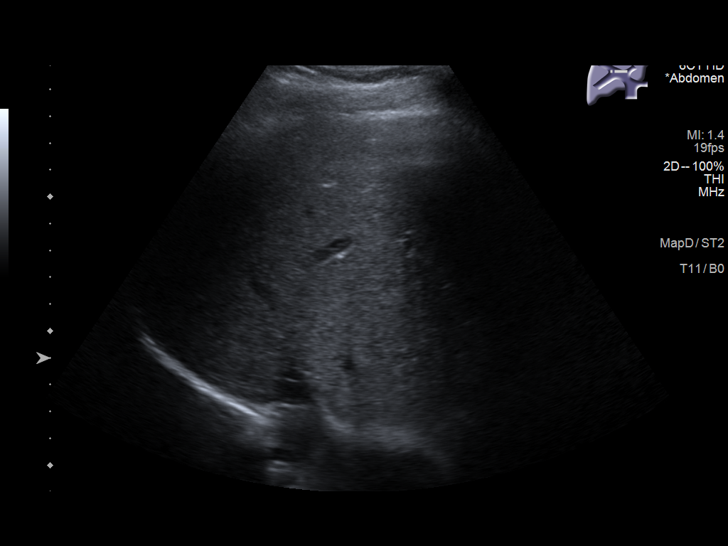
[im 46/46]
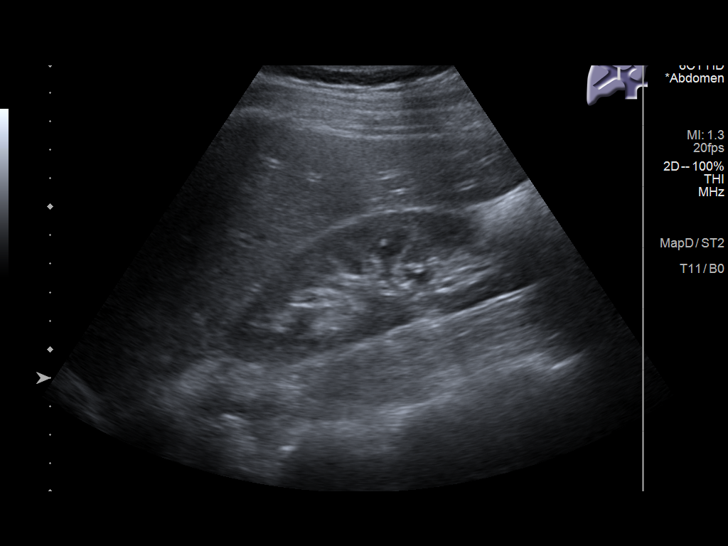

[14 of 25 positions shown; findings below may reference images not displayed]

FINDINGS: Gallbladder:

No gallstones or wall thickening visualized. No sonographic Murphy
sign noted by sonographer.

Common bile duct:

Diameter: 2 mm which is within normal limits.

Liver:

No focal lesion identified. Within normal limits in parenchymal
echogenicity. Portal vein is patent on color Doppler imaging with
normal direction of blood flow towards the liver.

Other: None.
IMPRESSION: No abnormality seen in the right upper quadrant of the abdomen.

## 2022-06-11 IMAGING — US US PELVIS COMPLETE WITH TRANSVAGINAL
1 series · 14 of 25 positions shown · non-contrast
Comparison: 06/11/2016

CLINICAL DATA: Irregular menses.  History of septated uterus.



[Series 1: us pelvic complete with transvaginal · 14 of 100 slices shown]
[im 1/100]
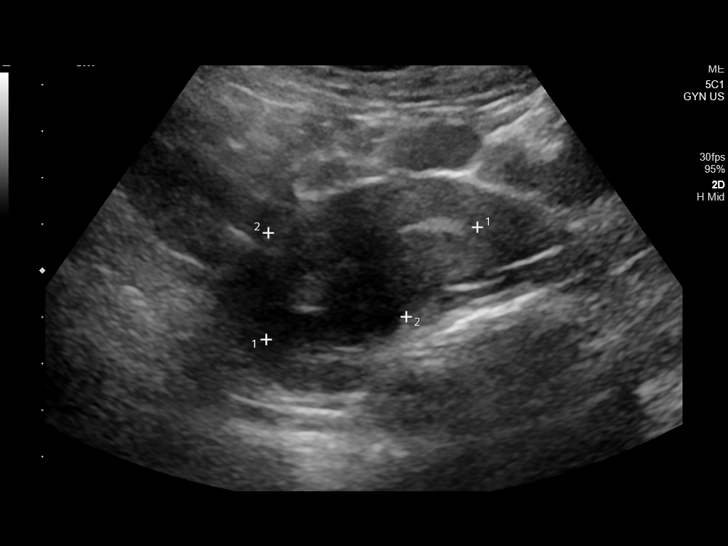
[im 9/100]
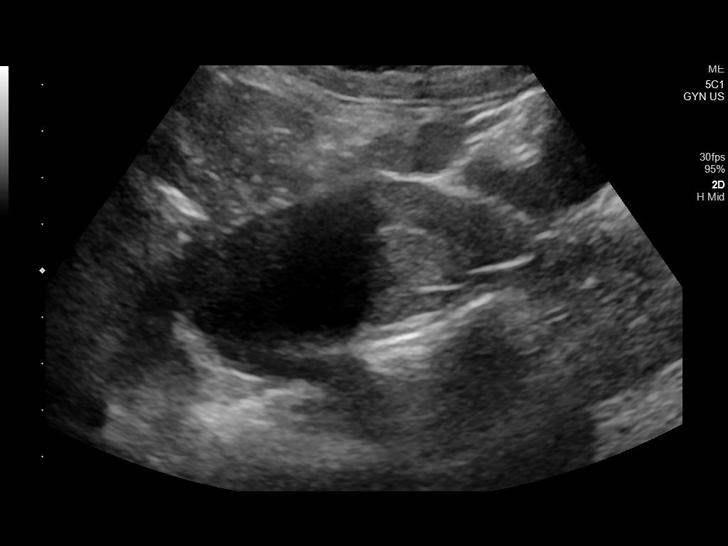
[im 17/100]
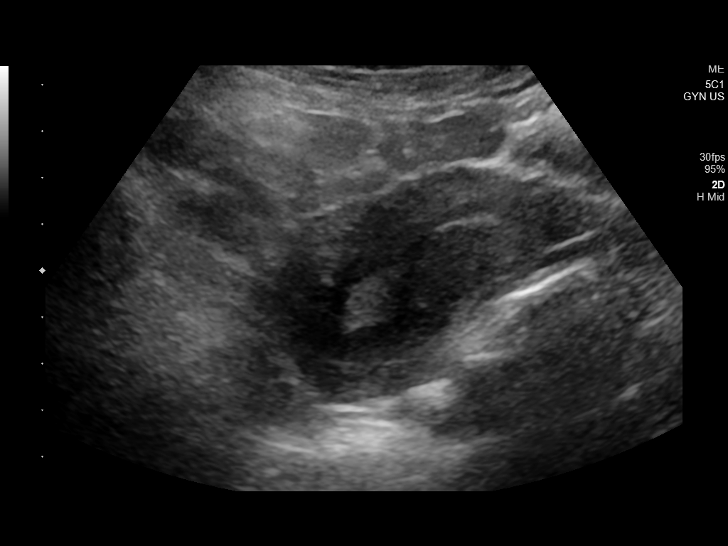
[im 25/100]
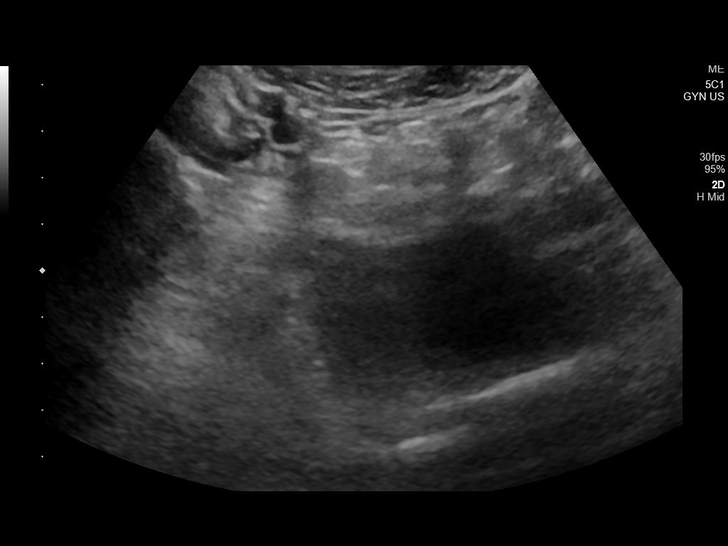
[im 34/100]
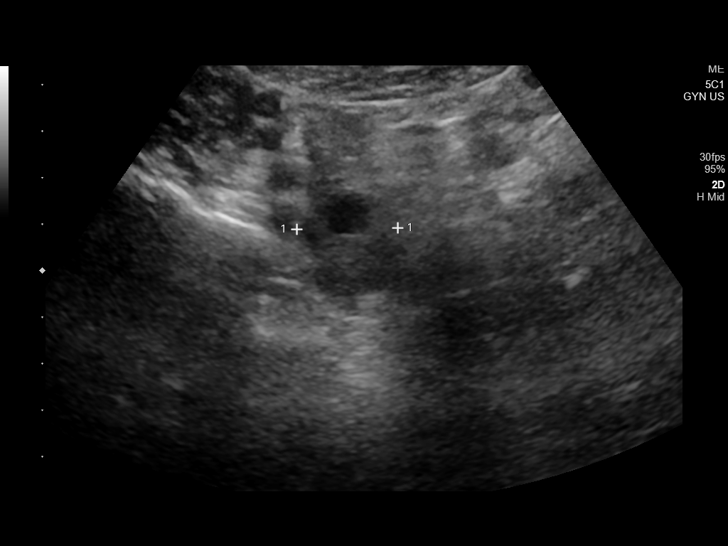
[im 38/100]
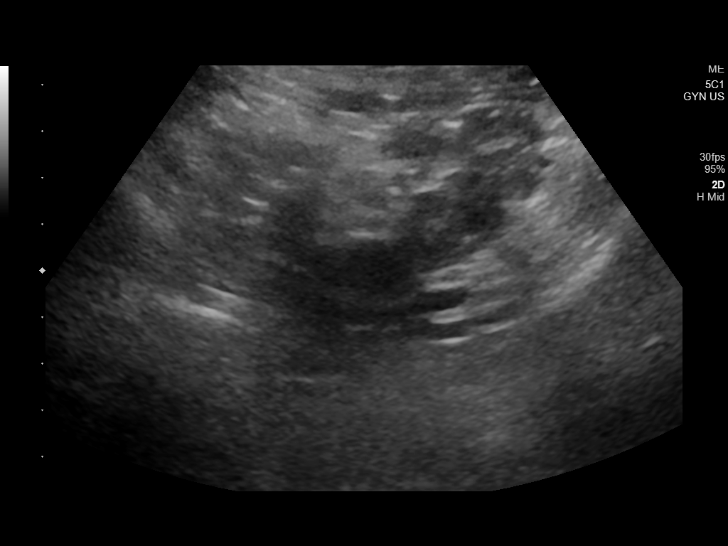
[im 46/100]
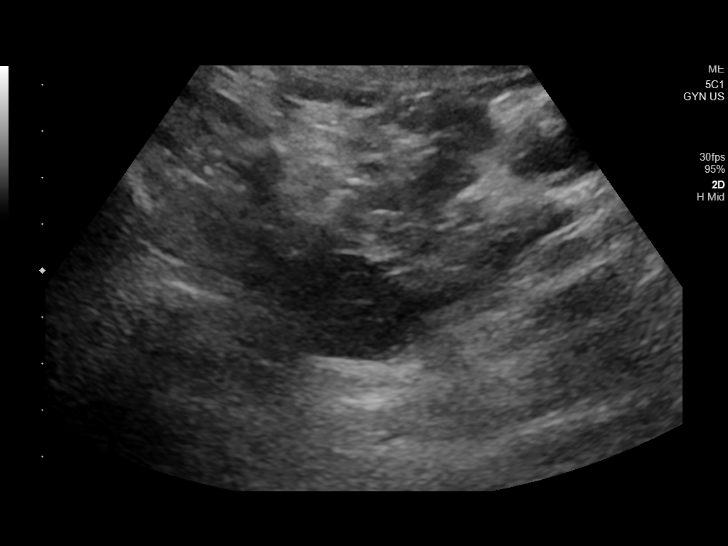
[im 54/100]
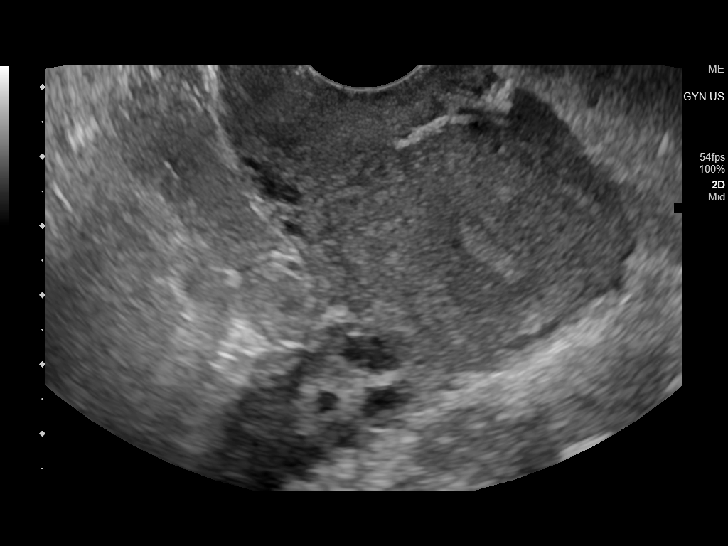
[im 62/100]
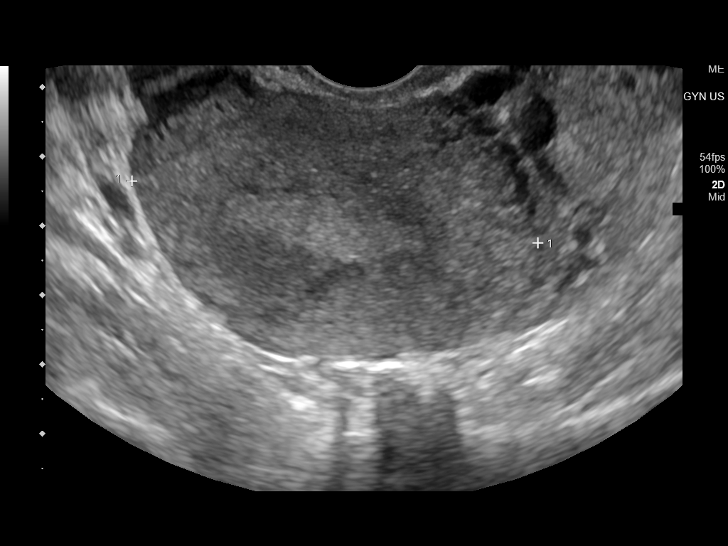
[im 67/100]
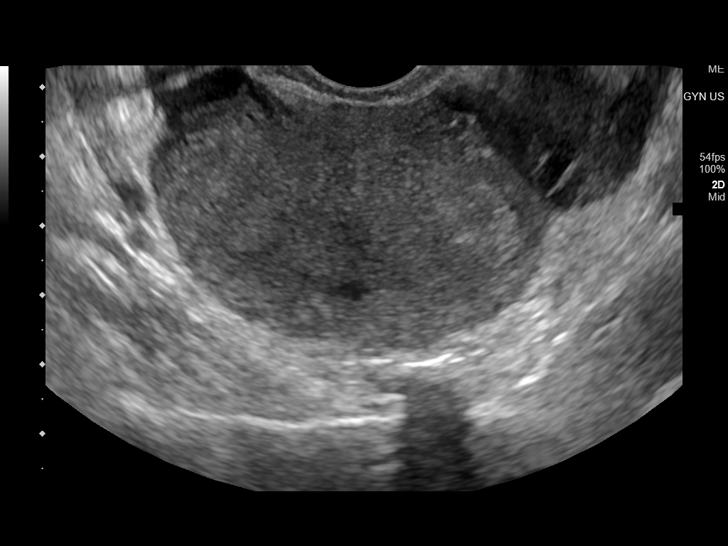
[im 75/100]
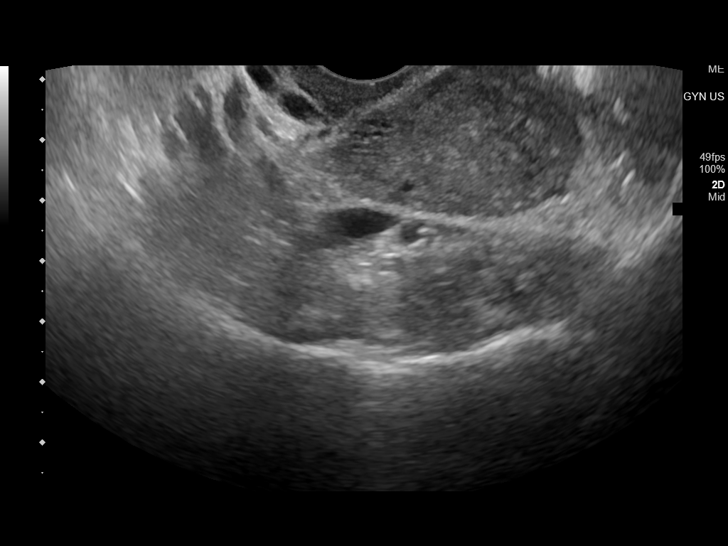
[im 83/100]
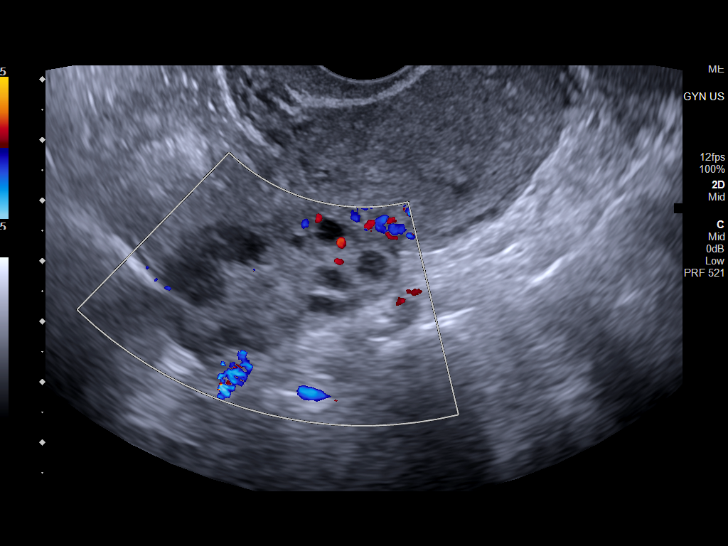
[im 91/100]
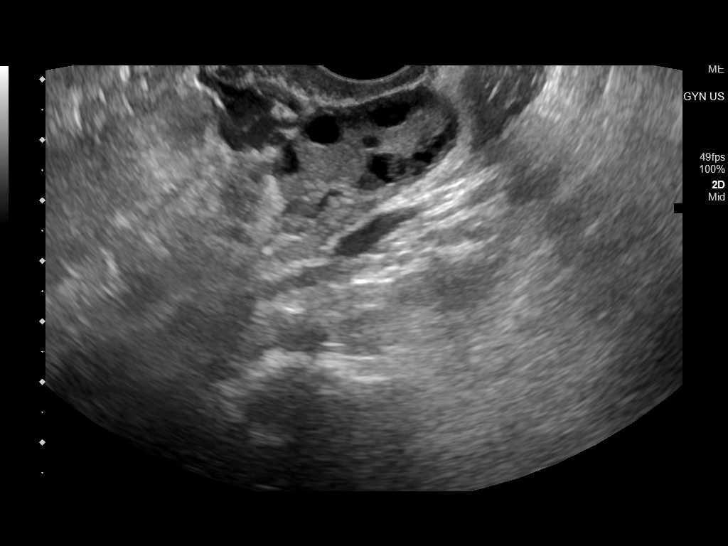
[im 100/100]
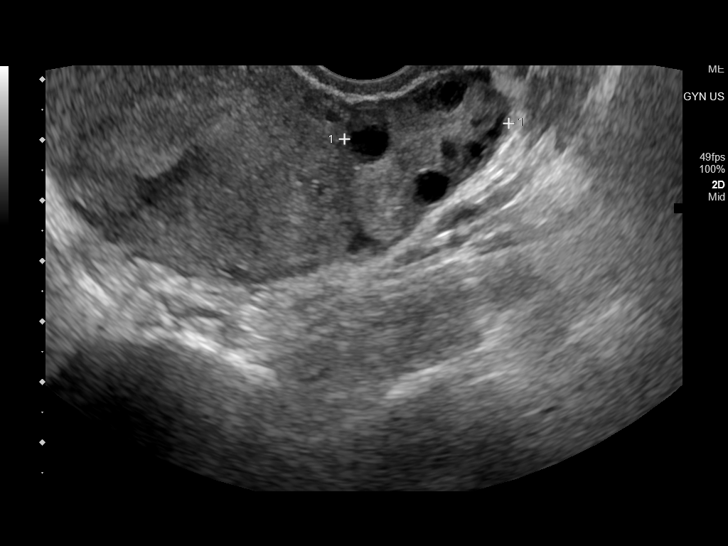

[14 of 25 positions shown; findings below may reference images not displayed]

FINDINGS: Uterus

Measurements: 6.4 x 3.7 x 5.9 cm = volume: 73 mL. The septated
uterus configuration is less well appreciated on this study.

Endometrium

Thickness: 8 mm.  No focal abnormality visualized.

Right ovary

Measurements: 3.9 x 2.2 x 2.5 center = volume: 12 mL. Normal
appearance/no adnexal mass.

Left ovary

Measurements: 3.9 x 2.1 x 2.7 cm = volume: 11 mL. Normal
appearance/no adnexal mass.

Other findings

No abnormal free fluid.
IMPRESSION: Unremarkable examination. The patient's reported septate uterus is
less well appreciated on today's study. This may be secondary to
imaging technique.

## 2022-09-02 ENCOUNTER — Encounter: Payer: Self-pay | Admitting: Family Medicine

## 2022-09-02 ENCOUNTER — Ambulatory Visit (INDEPENDENT_AMBULATORY_CARE_PROVIDER_SITE_OTHER): Payer: BC Managed Care – PPO | Admitting: Family Medicine

## 2022-09-02 VITALS — BP 116/76 | HR 67 | Temp 98.5°F | Ht 66.0 in | Wt 160.6 lb

## 2022-09-02 DIAGNOSIS — Z0000 Encounter for general adult medical examination without abnormal findings: Secondary | ICD-10-CM | POA: Diagnosis not present

## 2022-09-02 DIAGNOSIS — E559 Vitamin D deficiency, unspecified: Secondary | ICD-10-CM

## 2022-09-02 NOTE — Assessment & Plan Note (Signed)
UTD on dental and vision Just got back from a cruise with her SO and their child Things to do to keep yourself healthy  - Exercise at least 30-45 minutes a day, 3-4 days a week.  - Eat a low-fat diet with lots of fruits and vegetables, up to 7-9 servings per day.  - Seatbelts can save your life. Wear them always.  - Smoke detectors on every level of your home, check batteries every year.  - Eye Doctor - have an eye exam every 1-2 years  - Safe sex - if you may be exposed to STDs, use a condom.  - Alcohol -  If you drink, do it moderately, less than 2 drinks per day.  - [place]. Choose someone to speak for you if you are not able.  - Depression is common in our stressful world.If you're feeling down or losing interest in things you normally enjoy, please come in for a visit.  - Violence - If anyone is threatening or hurting you, please call immediately.

## 2022-09-02 NOTE — Assessment & Plan Note (Signed)
Chronic, previously stable with OTC supplementation Repeat labs given complaints of coolness in extremities

## 2022-09-02 NOTE — Progress Notes (Signed)
I,Connie R Striblin,acting as a Education administrator for Gwyneth Sprout, FNP.,have documented all relevant documentation on the behalf of Gwyneth Sprout, FNP,as directed by  Gwyneth Sprout, FNP while in the presence of Gwyneth Sprout, FNP.   Complete physical exam  Patient: Laura Carey   DOB: 05/23/1986   37 y.o. Female  MRN: [id number] Visit Date: 09/02/2022  Today's healthcare provider: Gwyneth Sprout, FNP  Re Introduced to nurse practitioner role and practice setting.  All questions answered.  Discussed provider/patient relationship and expectations.   Subjective    Laura Carey is a 37 y.o. female who presents today for a complete physical exam.  She reports consuming a general diet. The patient does not participate in regular exercise at present. She generally feels fairly well. She reports sleeping fairly well. She does have additional problems to discuss today.   HPI  Pt complains of her lower extremities getting abnormally cold constantly, condition progressing over time.   History reviewed. No pertinent past medical history. Past Surgical History:  Procedure Laterality Date   HYSTEROSCOPY  01/2017   Social History   Socioeconomic History   Marital status: Married    Spouse name: Not on file   Number of children: Not on file   Years of education: Not on file   Highest education level: Not on file  Occupational History   Not on file  Tobacco Use   Smoking status: Never   Smokeless tobacco: Never  Vaping Use   Vaping Use: Never used  Substance and Sexual Activity   Alcohol use: Yes    Comment: social   Drug use: No   Sexual activity: Yes    Birth control/protection: None  Other Topics Concern   Not on file  Social History Narrative   Not on file   Social Determinants of Health   Financial Resource Strain: Not on file  Food Insecurity: Not on file  Transportation Needs: Not on file  Physical Activity: Not on file  Stress: Not on file  Social Connections: Not on file   Intimate Partner Violence: Not on file   Family Status  Relation Name Status   Mother  Alive   Father  Alive   MGM  Deceased   MGF  Deceased   PGM  Deceased   PGF  Deceased   Family History  Problem Relation Age of Onset   Cancer Maternal Grandmother        breast   No Known Allergies  Patient Care Team: Gwyneth Sprout, FNP as PCP - General (Family Medicine)   Medications: No outpatient medications prior to visit.   No facility-administered medications prior to visit.    Review of Systems   Objective    BP 116/76 (BP Location: Right Arm, Patient Position: Sitting, Cuff Size: Normal)   Pulse 67   Temp 98.5 F (36.9 C) (Oral)   Ht 5\' 6"  (1.676 m)   Wt 160 lb 9.6 oz (72.8 kg)   LMP 07/29/2022 (Approximate)   SpO2 100%   BMI 25.92 kg/m    Physical Exam   Last depression screening scores    08/26/2021    3:17 PM 07/03/2020    3:24 PM 05/08/2020    3:47 PM  PHQ 2/9 Scores  PHQ - 2 Score 1 1 1   PHQ- 9 Score 5 3 2    Last fall risk screening    07/03/2020    3:24 PM  Fall Risk   Falls in the past  year? 0  Number falls in past yr: 0  Injury with Fall? 0   Last Audit-C alcohol use screening    05/08/2020    3:47 PM  Alcohol Use Disorder Test (AUDIT)  1. How often do you have a drink containing alcohol? 2  2. How many drinks containing alcohol do you have on a typical day when you are drinking? 0  3. How often do you have six or more drinks on one occasion? 0  AUDIT-C Score 2   A score of 3 or more in women, and 4 or more in men indicates increased risk for alcohol abuse, EXCEPT if all of the points are from question 1   No results found for any visits on 09/02/22.  Assessment & Plan    Routine Health Maintenance and Physical Exam  Exercise Activities and Dietary recommendations  Goals   None     Immunization History  Administered Date(s) Administered   MMR 08/12/2013   PFIZER(Purple Top)SARS-COV-2 Vaccination 04/23/2020, 05/14/2020   Tdap  01/05/2018    Health Maintenance  Topic Date Due   COVID-19 Vaccine (3 - Pfizer risk series) 09/18/2022 (Originally 06/11/2020)   PAP SMEAR-Modifier  08/22/2023   DTaP/Tdap/Td (2 - Td or Tdap) 01/06/2028   Hepatitis C Screening  Completed   HIV Screening  Completed   HPV VACCINES  Aged Out   INFLUENZA VACCINE  Discontinued    Discussed health benefits of physical activity, and encouraged her to engage in regular exercise appropriate for her age and condition.  Problem List Items Addressed This Visit       Other   Annual physical exam - Primary    UTD on dental and vision Just got back from a cruise with her SO and their child Things to do to keep yourself healthy  - Exercise at least 30-45 minutes a day, 3-4 days a week.  - Eat a low-fat diet with lots of fruits and vegetables, up to 7-9 servings per day.  - Seatbelts can save your life. Wear them always.  - Smoke detectors on every level of your home, check batteries every year.  - Eye Doctor - have an eye exam every 1-2 years  - Safe sex - if you may be exposed to STDs, use a condom.  - Alcohol -  If you drink, do it moderately, less than 2 drinks per day.  - [place]. Choose someone to speak for you if you are not able.  - Depression is common in our stressful world.If you're feeling down or losing interest in things you normally enjoy, please come in for a visit.  - Violence - If anyone is threatening or hurting you, please call immediately.       Relevant Orders   CBC   Basic Metabolic Panel (BMET)   TSH   Vitamin D (25 hydroxy)   Lipid panel   Avitaminosis D    Chronic, previously stable with OTC supplementation Repeat labs given complaints of coolness in extremities       Relevant Orders   Vitamin D (25 hydroxy)   Return in about 1 year (around 09/03/2023) for annual examination.    Vonna Kotyk, FNP, have reviewed all documentation for this visit. The documentation on 09/02/22 for  the exam, diagnosis, procedures, and orders are all accurate and complete.  Gwyneth Sprout, [place] [phone] (phone) [phone] (fax)  [place]

## 2022-09-03 LAB — CBC
Hematocrit: 40.9 % (ref 34.0–46.6)
Hemoglobin: 13.4 g/dL (ref 11.1–15.9)
MCH: 27.7 pg (ref 26.6–33.0)
MCHC: 32.8 g/dL (ref 31.5–35.7)
MCV: 85 fL (ref 79–97)
Platelets: 281 10*3/uL (ref 150–450)
RBC: 4.84 x10E6/uL (ref 3.77–5.28)
RDW: 13.2 % (ref 11.7–15.4)
WBC: 8.6 10*3/uL (ref 3.4–10.8)

## 2022-09-03 LAB — BASIC METABOLIC PANEL
BUN/Creatinine Ratio: 15 (ref 9–23)
BUN: 12 mg/dL (ref 6–20)
CO2: 22 mmol/L (ref 20–29)
Calcium: 10.1 mg/dL (ref 8.7–10.2)
Chloride: 99 mmol/L (ref 96–106)
Creatinine, Ser: 0.79 mg/dL (ref 0.57–1.00)
Glucose: 88 mg/dL (ref 70–99)
Potassium: 4.3 mmol/L (ref 3.5–5.2)
Sodium: 136 mmol/L (ref 134–144)
eGFR: 99 mL/min/{1.73_m2} (ref >59)

## 2022-09-03 LAB — LIPID PANEL
Chol/HDL Ratio: 2.7 ratio (ref 0.0–4.4)
Cholesterol, Total: 153 mg/dL (ref 100–199)
HDL: 56 mg/dL (ref >39)
LDL Chol Calc (NIH): 85 mg/dL (ref 0–99)
Triglycerides: 56 mg/dL (ref 0–149)
VLDL Cholesterol Cal: 12 mg/dL (ref 5–40)

## 2022-09-03 LAB — VITAMIN D 25 HYDROXY (VIT D DEFICIENCY, FRACTURES): Vit D, 25-Hydroxy: 34.9 ng/mL (ref 30.0–100.0)

## 2022-09-03 LAB — TSH: TSH: 1.11 u[IU]/mL (ref 0.450–4.500)

## 2022-09-05 NOTE — Progress Notes (Signed)
Labs have been reviewed and are normal and/or stable.  Please let us know if you have any questions.  Thank you, Toshua Honsinger T Rumeal Cullipher, FNP  [facility] [address] [place], [place] [address] [phone] (phone) [phone] (fax) [facility] 

## 2022-09-22 ENCOUNTER — Ambulatory Visit: Payer: BC Managed Care – PPO | Admitting: Obstetrics and Gynecology

## 2022-09-29 ENCOUNTER — Ambulatory Visit: Payer: BC Managed Care – PPO | Admitting: Obstetrics and Gynecology

## 2022-11-04 NOTE — Patient Instructions (Signed)
Preventive Care 21-37 Years Old, Female Preventive care refers to lifestyle choices and visits with your health care provider that can promote health and wellness. Preventive care visits are also called wellness exams. What can I expect for my preventive care visit? Counseling During your preventive care visit, your health care provider may ask about your: Medical history, including: Past medical problems. Family medical history. Pregnancy history. Current health, including: Menstrual cycle. Method of birth control. Emotional well-being. Home life and relationship well-being. Sexual activity and sexual health. Lifestyle, including: Alcohol, nicotine or tobacco, and drug use. Access to firearms. Diet, exercise, and sleep habits. Work and work environment. Sunscreen use. Safety issues such as seatbelt and bike helmet use. Physical exam Your health care provider may check your: Height and weight. These may be used to calculate your BMI (body mass index). BMI is a measurement that tells if you are at a healthy weight. Waist circumference. This measures the distance around your waistline. This measurement also tells if you are at a healthy weight and may help predict your risk of certain diseases, such as type 2 diabetes and high blood pressure. Heart rate and blood pressure. Body temperature. Skin for abnormal spots. What immunizations do I need?  Vaccines are usually given at various ages, according to a schedule. Your health care provider will recommend vaccines for you based on your age, medical history, and lifestyle or other factors, such as travel or where you work. What tests do I need? Screening Your health care provider may recommend screening tests for certain conditions. This may include: Pelvic exam and Pap test. Lipid and cholesterol levels. Diabetes screening. This is done by checking your blood sugar (glucose) after you have not eaten for a while (fasting). Hepatitis  B test. Hepatitis C test. HIV (human immunodeficiency virus) test. STI (sexually transmitted infection) testing, if you are at risk. BRCA-related cancer screening. This may be done if you have a family history of breast, ovarian, tubal, or peritoneal cancers. Talk with your health care provider about your test results, treatment options, and if necessary, the need for more tests. Follow these instructions at home: Eating and drinking  Eat a healthy diet that includes fresh fruits and vegetables, whole grains, lean protein, and low-fat dairy products. Take vitamin and mineral supplements as recommended by your health care provider. Do not drink alcohol if: Your health care provider tells you not to drink. You are pregnant, may be pregnant, or are planning to become pregnant. If you drink alcohol: Limit how much you have to 0-1 drink a day. Know how much alcohol is in your drink. In the [place] one drink equals one 12 oz bottle of beer (355 mL), one 5 oz glass of wine (148 mL), or one 1 oz glass of hard liquor (44 mL). Lifestyle Brush your teeth every morning and night with fluoride toothpaste. Floss one time each day. Exercise for at least 30 minutes 5 or more days each week. Do not use any products that contain nicotine or tobacco. These products include cigarettes, chewing tobacco, and vaping devices, such as e-cigarettes. If you need help quitting, ask your health care provider. Do not use drugs. If you are sexually active, practice safe sex. Use a condom or other form of protection to prevent STIs. If you do not wish to become pregnant, use a form of birth control. If you plan to become pregnant, see your health care provider for a prepregnancy visit. Find healthy ways to manage stress, such as: Meditation,   yoga, or listening to music. Journaling. Talking to a trusted person. Spending time with friends and family. Minimize exposure to UV radiation to reduce your risk of skin  cancer. Safety Always wear your seat belt while driving or riding in a vehicle. Do not drive: If you have been drinking alcohol. Do not ride with someone who has been drinking. If you have been using any mind-altering substances or drugs. While texting. When you are tired or distracted. Wear a helmet and other protective equipment during sports activities. If you have firearms in your house, make sure you follow all gun safety procedures. Seek help if you have been physically or sexually abused. What's next? Go to your health care provider once a year for an annual wellness visit. Ask your health care provider how often you should have your eyes and teeth checked. Stay up to date on all vaccines. This information is not intended to replace advice given to you by your health care provider. Make sure you discuss any questions you have with your health care provider. Document Revised: 01/27/2021 Document Reviewed: 01/27/2021 Elsevier Patient Education  2023 Else[employer] Inc[employer] Breast Self-Awareness Breast self-awareness is knowing how your breasts look and feel. You need to: Check your breasts on a regular basis. Tell your doctor about any changes. Become familiar with the look and feel of your breasts. This can help you catch a breast problem while it is still small and can be treated. You should do breast self-exams even if you have breast implants. What you need: A mirror. A well-lit room. A pillow or other soft object. How to do a breast self-exam Follow these steps to do a breast self-exam: Look for changes  Take off all the clothes above your waist. Stand in front of a mirror in a room with good lighting. Put your hands down at your sides. Compare your breasts in the mirror. Look for any difference between them, such as: A difference in shape. A difference in size. Wrinkles, dips, and bumps in one breast and not the other. Look at each breast for changes in the skin, such  as: Redness. Scaly areas. Skin that has gotten thicker. Dimpling. Open sores (ulcers). Look for changes in your nipples, such as: Fluid coming out of a nipple. Fluid around a nipple. Bleeding. Dimpling. Redness. A nipple that looks pushed in (retracted), or that has changed position. Feel for changes Lie on your back. Feel each breast. To do this: Pick a breast to feel. Place a pillow under the shoulder closest to that breast. Put the arm closest to that breast behind your head. Feel the nipple area of that breast using the hand of your other arm. Feel the area with the pads of your three middle fingers by making small circles with your fingers. Use light, medium, and firm pressure. Continue the overlapping circles, moving downward over the breast. Keep making circles with your fingers. Stop when you feel your ribs. Start making circles with your fingers again, this time going upward until you reach your collarbone. Then, make circles outward across your breast and into your armpit area. Squeeze your nipple. Check for discharge and lumps. Repeat these steps to check your other breast. Sit or stand in the tub or shower. With soapy water on your skin, feel each breast the same way you did when you were lying down. Write down what you find Writing down what you find can help you remember what to tell your doctor. Write down: What is   normal for each breast. Any changes you find in each breast. These include: The kind of changes you find. A tender or painful breast. Any lump you find. Write down its size and where it is. When you last had your monthly period (menstrual cycle). General tips If you are breastfeeding, the best time to check your breasts is after you feed your baby or after you use a breast pump. If you get monthly bleeding, the best time to check your breasts is 5-7 days after your monthly cycle ends. With time, you will become comfortable with the self-exam. You will  also start to know if there are changes in your breasts. Contact a doctor if: You see a change in the shape or size of your breasts or nipples. You see a change in the skin of your breast or nipples, such as red or scaly skin. You have fluid coming from your nipples that is not normal. You find a new lump or thick area. You have breast pain. You have any concerns about your breast health. Summary Breast self-awareness includes looking for changes in your breasts and feeling for changes within your breasts. You should do breast self-awareness in front of a mirror in a well-lit room. If you get monthly periods (menstrual cycles), the best time to check your breasts is 5-7 days after your period ends. Tell your doctor about any changes you see in your breasts. Changes include changes in size, changes on the skin, painful or tender breasts, or fluid from your nipples that is not normal. This information is not intended to replace advice given to you by your health care provider. Make sure you discuss any questions you have with your health care provider. Document Revised: 01/06/2022 Document Reviewed: 06/03/2021 Elsevier Patient Education  2023 Elsevier Inc.  

## 2022-11-04 NOTE — Progress Notes (Unsigned)
GYNECOLOGY ANNUAL PHYSICAL EXAM PROGRESS NOTE  Subjective:    Laura Carey is a 37 y.o. G39P1021 female who presents for an annual exam. The patient has no complaints today. The patient {is/is not/has never been:13135} sexually active. The patient participates in regular exercise: {yes/no/not asked:9010}. Has the patient ever been transfused or tattooed?: {yes/no/not asked:9010}. The patient reports that there {is/is not:9024} domestic violence in her life.    Menstrual History: Menarche age: cannot recall but was early teen  No LMP recorded.     Gynecologic History:  Contraception: rhythm method History of STI's: Denies Last Pap: 08/21/2020. Results were: normal. Denies h/o abnormal pap smears. Last mammogram: Not age appropriate     OB History  Gravida Para Term Preterm AB Living  3 1 1  0 2 1  SAB IAB Ectopic Multiple Live Births  2 0 0 0 1    # Outcome Date GA Lbr Len/2nd Weight Sex Delivery Anes PTL Lv  3 Term 03/17/18 [redacted]w[redacted]d  7 lb 15 oz (3.6 kg) F Vag-Spont EPI N LIV  2 SAB 2017 [redacted]w[redacted]d         1 SAB 2016 [redacted]w[redacted]d           No past medical history on file.  Past Surgical History:  Procedure Laterality Date   HYSTEROSCOPY  01/2017    Family History  Problem Relation Age of Onset   Cancer Maternal Grandmother        breast    Social History   Socioeconomic History   Marital status: Married    Spouse name: Not on file   Number of children: Not on file   Years of education: Not on file   Highest education level: Not on file  Occupational History   Not on file  Tobacco Use   Smoking status: Never   Smokeless tobacco: Never  Vaping Use   Vaping Use: Never used  Substance and Sexual Activity   Alcohol use: Yes    Comment: social   Drug use: No   Sexual activity: Yes    Birth control/protection: None  Other Topics Concern   Not on file  Social History Narrative   Not on file   Social Determinants of Health   Financial Resource Strain: Not on file   Food Insecurity: Not on file  Transportation Needs: Not on file  Physical Activity: Not on file  Stress: Not on file  Social Connections: Not on file  Intimate Partner Violence: Not on file    No current outpatient medications on file prior to visit.   No current facility-administered medications on file prior to visit.    No Known Allergies   Review of Systems Constitutional: negative for chills, fatigue, fevers and sweats Eyes: negative for irritation, redness and visual disturbance Ears, nose, mouth, throat, and face: negative for hearing loss, nasal congestion, snoring and tinnitus Respiratory: negative for asthma, cough, sputum Cardiovascular: negative for chest pain, dyspnea, exertional chest pressure/discomfort, irregular heart beat, palpitations and syncope Gastrointestinal: negative for abdominal pain, change in bowel habits, nausea and vomiting Genitourinary: negative for abnormal menstrual periods, genital lesions, sexual problems and vaginal discharge, dysuria and urinary incontinence Integument/breast: negative for breast lump, breast tenderness and nipple discharge Hematologic/lymphatic: negative for bleeding and easy bruising Musculoskeletal:negative for back pain and muscle weakness Neurological: negative for dizziness, headaches, vertigo and weakness Endocrine: negative for diabetic symptoms including polydipsia, polyuria and skin dryness Allergic/Immunologic: negative for hay fever and urticaria      Objective:  There were no vitals taken for this visit. There is no height or weight on file to calculate BMI.    General Appearance:    Alert, cooperative, no distress, appears stated age  Head:    Normocephalic, without obvious abnormality, atraumatic  Eyes:    PERRL, conjunctiva/corneas clear, EOM's intact, both eyes  Ears:    Normal external ear canals, both ears  Nose:   Nares normal, septum midline, mucosa normal, no drainage or sinus tenderness  Throat:    Lips, mucosa, and tongue normal; teeth and gums normal  Neck:   Supple, symmetrical, trachea midline, no adenopathy; thyroid: no enlargement/tenderness/nodules; no carotid bruit or JVD  Back:     Symmetric, no curvature, ROM normal, no CVA tenderness  Lungs:     Clear to auscultation bilaterally, respirations unlabored  Chest Wall:    No tenderness or deformity   Heart:    Regular rate and rhythm, S1 and S2 normal, no murmur, rub or gallop  Breast Exam:    No tenderness, masses, or nipple abnormality  Abdomen:     Soft, non-tender, bowel sounds active all four quadrants, no masses, no organomegaly.    Genitalia:    Pelvic:external genitalia normal, vagina without lesions, discharge, or tenderness, rectovaginal septum  normal. Cervix normal in appearance, no cervical motion tenderness, no adnexal masses or tenderness.  Uterus normal size, shape, mobile, regular contours, nontender.  Rectal:    Normal external sphincter.  No hemorrhoids appreciated. Internal exam not done.   Extremities:   Extremities normal, atraumatic, no cyanosis or edema  Pulses:   2+ and symmetric all extremities  Skin:   Skin color, texture, turgor normal, no rashes or lesions  Lymph nodes:   Cervical, supraclavicular, and axillary nodes normal  Neurologic:   CNII-XII intact, normal strength, sensation and reflexes throughout   .  Labs:  Lab Results  Component Value Date   WBC 8.6 09/02/2022   HGB 13.4 09/02/2022   HCT 40.9 09/02/2022   MCV 85 09/02/2022   PLT 281 09/02/2022    Lab Results  Component Value Date   CREATININE 0.79 09/02/2022   BUN 12 09/02/2022   NA 136 09/02/2022   K 4.3 09/02/2022   CL 99 09/02/2022   CO2 22 09/02/2022    Lab Results  Component Value Date   ALT 8 08/26/2021   AST 13 08/26/2021   ALKPHOS 57 08/26/2021   BILITOT 1.2 08/26/2021    Lab Results  Component Value Date   TSH 1.110 09/02/2022     Assessment:   No diagnosis found.   Plan:  Blood tests: UTD by  PCP. Breast self exam technique reviewed and patient encouraged to perform self-exam monthly. Contraception: rhythm method. Discussed healthy lifestyle modifications. Mammogram  : Not age appropriate Pap smear  UTD . COVID vaccination status: Follow up in 1 year for annual exam   Rubie Maid, MD [place]

## 2022-11-08 ENCOUNTER — Encounter: Payer: Self-pay | Admitting: Family Medicine

## 2022-11-08 ENCOUNTER — Ambulatory Visit (INDEPENDENT_AMBULATORY_CARE_PROVIDER_SITE_OTHER): Payer: BC Managed Care – PPO | Admitting: Obstetrics and Gynecology

## 2022-11-08 ENCOUNTER — Encounter: Payer: Self-pay | Admitting: Obstetrics and Gynecology

## 2022-11-08 VITALS — BP 105/65 | HR 74 | Resp 16 | Ht 66.0 in | Wt 165.0 lb

## 2022-11-08 DIAGNOSIS — Z803 Family history of malignant neoplasm of breast: Secondary | ICD-10-CM

## 2022-11-08 DIAGNOSIS — Z01419 Encounter for gynecological examination (general) (routine) without abnormal findings: Secondary | ICD-10-CM

## 2022-11-08 DIAGNOSIS — Q5128 Other doubling of uterus, other specified: Secondary | ICD-10-CM

## 2022-11-08 DIAGNOSIS — K640 First degree hemorrhoids: Secondary | ICD-10-CM

## 2022-11-08 MED ORDER — HYDROCORTISONE ACETATE 25 MG RE SUPP: 25.0000 mg | Freq: Two times a day (BID) | RECTAL | 1 refills | Status: AC

## 2022-11-15 ENCOUNTER — Other Ambulatory Visit: Payer: Self-pay | Admitting: Family Medicine

## 2022-11-15 DIAGNOSIS — K120 Recurrent oral aphthae: Secondary | ICD-10-CM

## 2022-11-15 MED ORDER — CLOBETASOL PROPIONATE 0.05 % EX OINT
1.0000 | TOPICAL_OINTMENT | Freq: Three times a day (TID) | CUTANEOUS | 0 refills | Status: DC
Start: 2022-11-15 — End: 2023-02-03

## 2022-11-25 ENCOUNTER — Ambulatory Visit (INDEPENDENT_AMBULATORY_CARE_PROVIDER_SITE_OTHER): Payer: BC Managed Care – PPO

## 2022-11-25 VITALS — BP 103/67 | HR 53 | Ht 66.0 in | Wt 164.0 lb

## 2022-11-25 DIAGNOSIS — Z803 Family history of malignant neoplasm of breast: Secondary | ICD-10-CM

## 2022-11-25 NOTE — Progress Notes (Signed)
    NURSE VISIT NOTE  Subjective:    Patient ID: Laura Carey, female    DOB: [birth year], 37 y.o.   MRN: [id number]  HPI  Patient is a 37 y.o. G12P1021 female who presents for [facility] testing due to family history of breast cancer. Patient denies drinking, eating, or smoking in the last 30 minutes.   Objective:    BP 103/67   Pulse (!) 53   Ht 5\' 6"  (1.676 m)   Wt 164 lb (74.4 kg)   LMP 10/29/2022 (Exact Date)   BMI 26.47 kg/m      Assessment:   1. Family history of breast cancer      Plan:   Sample collected and sent.  Donnetta Hail, CMA

## 2023-01-31 ENCOUNTER — Encounter: Payer: Self-pay | Admitting: Obstetrics and Gynecology

## 2023-02-03 ENCOUNTER — Encounter: Payer: Self-pay | Admitting: Obstetrics and Gynecology

## 2023-02-03 ENCOUNTER — Telehealth (INDEPENDENT_AMBULATORY_CARE_PROVIDER_SITE_OTHER): Payer: BC Managed Care – PPO | Admitting: Obstetrics and Gynecology

## 2023-02-03 VITALS — Ht 66.0 in | Wt 162.0 lb

## 2023-02-03 DIAGNOSIS — Z712 Person consulting for explanation of examination or test findings: Secondary | ICD-10-CM | POA: Diagnosis not present

## 2023-02-03 DIAGNOSIS — Z1589 Genetic susceptibility to other disease: Secondary | ICD-10-CM | POA: Diagnosis not present

## 2023-02-03 DIAGNOSIS — Z803 Family history of malignant neoplasm of breast: Secondary | ICD-10-CM

## 2023-02-03 NOTE — Progress Notes (Signed)
Can you add this patient to my schedule at 4:30 for a televisit to discuss her results?

## 2023-02-03 NOTE — Progress Notes (Signed)
Virtual Visit via Video Note  I connected with Laura Carey on 02/03/23 at  4:30 PM EDT by a video enabled telemedicine application and verified that I am speaking with the correct person using two identifiers.  Location: Patient: Work Provider: Office   I discussed the limitations of evaluation and management by telemedicine and the availability of in person appointments. The patient expressed understanding and agreed to proceed.  History of Present Illness: Patient is a 37 y.o. [id number] who presents for discussion of genetic testing results.  She had hereditary cancer genetic screening based on family history of breast cancer.   Family History  Problem Relation Age of Onset   Breast cancer Maternal Grandmother 79 - 21   Breast cancer Mother 57      Observations/Objective: Height 5\' 6"  (1.676 m), weight 162 lb (73.5 kg). Gen App: NAD Remainder of exam deferred.    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   Discussed implications of gene mutations.  With CHEK2 gene, increased risk of breast cancer in lifetime (20-44%).  Would encourage routine screenings. Would not require earlier screenings before age 21 as both family members with diagnosis in 28s'-70's. 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 No additional or earlier screening required at this time, however would recommend colonoscopy at appropriate screening age (age 36) vs other methods of screening. All questions answered.   Follow Up Instructions:    I discussed the assessment and treatment plan with the patient. The patient was provided an opportunity to ask questions and all were answered. The patient agreed with the plan and demonstrated an understanding of the instructions.   The patient was advised to call back or seek an in-person evaluation if the symptoms worsen or if the condition  fails to improve as anticipated.  I provided 14 minutes of non-face-to-face time during this encounter.   Hildred Laser, MD [facility]

## 2023-02-04 NOTE — Patient Instructions (Signed)
Cancer Screening: Female A cancer screening is a test or exam that checks for cancer. Work with your health care provider to create a cancer screening schedule that protects your health. Who should have screening? All females should be considered for screening of certain cancers, including breast cancer, cervical cancer, colorectal cancer, endometrial cancer, lung cancer, and skin cancer. Your health care provider may recommend screenings for other types of cancer if: You have had cancer before. You have a family member with cancer. You have genes that could increase the risk of cancer. You have risk factors for certain cancers, such as current or past use of tobacco products or being overweight. What are the benefits of screening? Cancer screening is done to look for cancer in the very early stages, before it spreads and becomes harder to treat and before you would start to notice symptoms. Finding cancer early improves the chances of successful treatment. It may save your life. When should I be screened for cancer? When you should be screened for cancer depends on: Your age. Your medical history and your family's medical history. Certain lifestyle factors, such as smoking or other use of tobacco products. Environmental exposure, such as to asbestos. How is screening done? Breast cancer Breast cancer screening is done with a test that takes images of breast tissue (mammogram) using an X-ray machine. Here are some screening guidelines for females at average risk: When you are 66-52 years old, you should be given the choice to start having mammograms. When you are 39-50 years old, you should have a mammogram every year. You may start having mammograms before you are 37 years old if you have risk factors for breast cancer, such as having an immediate family member with breast cancer. At 49 years old or older, you should have a mammogram every 1-2 years for as long as you are in good health and  have a life expectancy of 10 years or longer. It is important to know what your breasts look and feel like so you can report any changes to your health care provider.  Cervical cancer Cervical cancer screening is done with an HPV (human papillomavirus) test to identify the virus that causes cervical cancer. To perform the test, a health care provider takes a swab of cells from the lowest part of the uterus (cervix) during a pelvic exam. This test may be performed along with a Pap test. This testchecks for abnormalities in the cervix. All females at average risk should consider being screened for cervical cancer starting no later than 37 years old and continuing until 37 years old. Screening should not begin earlier than 37 years old. You will have tests every 3-5 years, depending on your results and the type of screening test. Talk with your health care provider about which screening test is right for you and how often you should be screened. If you have had the HPV vaccine, you will still be screened for cervical cancer and follow normal screening recommendations. You do not need to be screened for cervical cancer if any of the following apply to you: You are older than 37 years old and you have had normal screening tests in the past 10 years with no serious cervical precancer or cancer in the last 25 years. Your cervix and uterus have been removed, and you have never had cervical cancer or abnormal cells that could become cancer (precancerous cells). Colorectal cancer Colorectal cancer screening looks for cancer or for growths called polyps that often form  before cancer starts. Tests to look for cancer or polyps include: Colonoscopy or flexible sigmoidoscopy. For these procedures, a flexible tube with a small camera is inserted into the rectum. CT colonography. This test uses X-rays and contrast dye to check the colon for polyps. Tests to look for cancer in the stool (feces) include: Guaiac-based  fecal occult blood test (FOBT). This test can find blood in stool. It can be done at home with a kit. Fecal immunochemical test (FIT). This test can find blood in stool. For this test, you will need to collect stool samples at home. Stool DNA test. This test looks for blood in stool and any changes in DNA that can lead to colon cancer. For this test, you will need to collect a stool sample at home and send it to a lab. All adults should have screenings starting at 37 years old and continuing through 37 years old. For females 47-82 years old, the decision to be screened should be based on a person's preferences, life expectancy, overall health, and prior screening history. Your health care provider may recommend screening before 37 years old. You will have tests every 1-10 years, depending on your results and the type of screening test. People at increased risk should start screening at an earlier age. Talk with your health care provider about which screening test is right for you and how often you should be screened. Endometrial cancer There is no standard screening test for endometrial cancer, and females at average risk do not routinely need to have this screening. Talk with your health care provider about whether screening is right for you. If it is, this screening is performed through: Endometrial tissue biopsy. This tests a sample of tissue taken from the lining of the uterus. Vaginal ultrasound. If you are at increased risk for endometrial cancer, you may need to have these tests more often than normal. You are at increased risk if: You have a family history of ovarian, uterine, or certain types of colon cancer. You are taking tamoxifen, a medicine used to treat breast cancer. If you have reached menopause, it is especially important to talk with your health care provider about any vaginal bleeding or spotting. Screening for endometrial cancer is not recommended for females who do not have symptoms  of the cancer, such as vaginal bleeding. Lung cancer Lung cancer screening is done with a CT scan that looks for abnormal changes in the lungs. Discuss lung cancer screening with your health care provider if you are 45-26 years old and if any of the following apply to you: You currently smoke. You used to smoke heavily. You have a smoking history of 1 pack of cigarettes a day for 20 years or 2 packs a day for 10 years. You may need to be screened every year if you smoke heavily or if you used to smoke. Skin cancer Skin cancer screening is done by checking the skin for unusual moles or spots and any changes in existing moles. Your health care provider should check your skin for signs of skin cancer at every physical exam. You should check your skin every month and tell your health care provider right away if anything looks unusual. Females with a higher-than-normal risk for skin cancer may want to see a skin specialist (dermatologist) for an annual body check. Where to find more information American Cancer Society: cancer.org Centers for Disease Control and Prevention: TonerPromos.no National Cancer Institute: cancer.gov U.S. Department of Health and Human Services: TravelLesson.ca Contact  a health care provider if: You have concerns about any signs or symptoms of cancer. These may include: Skin problems. You may have: Moles of an unusual shape or color. Changes in existing moles. A sore on your skin that does not heal. Tiredness (fatigue) that does not go away. Losing weight without trying. Blood in your urine or stool. Problems with coughing or breathing. These may include: Coughing or trouble breathing that does not go away. Coughing up blood. Lumps or other changes in your breasts. Vaginal bleeding, spotting, or changes in your period. Frequent pain or cramping in your abdomen. This information is not intended to replace advice given to you by your health care provider. Make sure you  discuss any questions you have with your health care provider. Document Revised: 08/09/2022 Document Reviewed: 02/21/2022 Elsevier Patient Education  2024 [employer].

## 2023-02-13 ENCOUNTER — Ambulatory Visit
Admission: EM | Admit: 2023-02-13 | Discharge: 2023-02-13 | Disposition: A | Discharge status: Home / Self Care | Payer: BC Managed Care – PPO | Attending: Urgent Care | Admitting: Urgent Care

## 2023-02-13 DIAGNOSIS — J020 Streptococcal pharyngitis: Secondary | ICD-10-CM | POA: Diagnosis not present

## 2023-02-13 LAB — POCT RAPID STREP A (OFFICE): Rapid Strep A Screen: POSITIVE — AB

## 2023-02-13 MED ORDER — AMOXICILLIN 500 MG PO CAPS
500.0000 mg | ORAL_CAPSULE | Freq: Two times a day (BID) | ORAL | 0 refills | Status: AC
Start: 2023-02-13 — End: 2023-02-23

## 2023-02-13 MED ORDER — PREDNISONE 20 MG PO TABS
40.0000 mg | ORAL_TABLET | Freq: Every day | ORAL | 0 refills | Status: AC
Start: 2023-02-13 — End: 2023-02-15

## 2023-02-13 NOTE — Discharge Instructions (Signed)
Follow up here or with your primary care provider if your symptoms are worsening or not improving with treatment.     

## 2023-02-13 NOTE — ED Triage Notes (Signed)
Patient presents to UC for sore throat x 4 days. Treating with lozenges and hot tea.

## 2023-02-13 NOTE — ED Provider Notes (Signed)
Laura Carey    CSN: [id number] Arrival date & time: 02/13/23  0825      History   Chief Complaint Chief Complaint  Patient presents with   Sore Throat    HPI Laura Carey is a 37 y.o. female.    Sore Throat  Presents to urgent care with complaint of sore throat x 4 days.  Denies fever.  No trismus, drooling, hoarseness, stridor.  PMH includes recurrent aphthous stomatitis, recently treated with topical steroid on 11/15/2022.  Past Medical History:  Diagnosis Date   No pertinent past medical history     Patient Active Problem List   Diagnosis Date Noted   Recurrent aphthous stomatitis 11/15/2022   Annual physical exam 08/26/2021   Avitaminosis D 08/26/2021    Past Surgical History:  Procedure Laterality Date   HYSTEROSCOPY  01/2017    OB History     Gravida  3   Para  1   Term  1   Preterm      AB  2   Living  1      SAB  2   IAB      Ectopic      Multiple      Live Births  1            Home Medications    Prior to Admission medications   Medication Sig Start Date End Date Taking? Authorizing Provider  hydrocortisone (ANUSOL-HC) 25 MG suppository Place 1 suppository (25 mg total) rectally 2 (two) times daily. 11/08/22   Hildred Laser, MD    Family History Family History  Problem Relation Age of Onset   Breast cancer Maternal Grandmother 83 - 72   Breast cancer Mother 65    Social History Social History   Tobacco Use   Smoking status: Never   Smokeless tobacco: Never  Vaping Use   Vaping Use: Never used  Substance Use Topics   Alcohol use: Yes    Comment: social   Drug use: No     Allergies   Patient has no known allergies.   Review of Systems Review of Systems   Physical Exam Triage Vital Signs ED Triage Vitals  Enc Vitals Group     BP      Pulse      Resp      Temp      Temp src      SpO2      Weight      Height      Head Circumference      Peak Flow      Pain Score      Pain Loc       Pain Edu?      Excl. in GC?    No data found.  Updated Vital Signs There were no vitals taken for this visit.  Visual Acuity Right Eye Distance:   Left Eye Distance:   Bilateral Distance:    Right Eye Near:   Left Eye Near:    Bilateral Near:     Physical Exam Vitals reviewed.  Constitutional:      Appearance: She is well-developed.  HENT:     Mouth/Throat:     Mouth: Mucous membranes are moist.     Pharynx: Posterior oropharyngeal erythema present. No oropharyngeal exudate.     Tonsils: No tonsillar exudate. 2+ on the right. 2+ on the left.  Skin:    General: Skin is warm and dry.  Neurological:  General: No focal deficit present.     Mental Status: She is alert and oriented to person, place, and time.  Psychiatric:        Mood and Affect: Mood normal.        Behavior: Behavior normal.      UC Treatments / Results  Labs (all labs ordered are listed, but only abnormal results are displayed) Labs Reviewed - No data to display  EKG   Radiology No results found.  Procedures Procedures (including critical care time)  Medications Ordered in UC Medications - No data to display  Initial Impression / Assessment and Plan / UC Course  I have reviewed the triage vital signs and the nursing notes.  Pertinent labs & imaging results that were available during my care of the patient were reviewed by me and considered in my medical decision making (see chart for details).   Laura Carey is a 37 y.o. female presenting with sore throat. Patient is afebrile without recent antipyretics, satting well on room air. Overall is well appearing, well hydrated, without respiratory distress.  Pharyngeal erythema is present.  No peritonsillar exudates.  Tonsils are 2+ bilaterally.  Reviewed relevant chart history.   Rapid strep result is positive.  Patient endorses difficulty swallowing and eating x 4 days so will give a couple of days of prednisone in addition to treating  with antibiotic therapy.  Counseled patient on potential for adverse effects with medications prescribed/recommended today, ER and return-to-clinic precautions discussed, patient verbalized understanding and agreement with care plan.  Final Clinical Impressions(s) / UC Diagnoses   Final diagnoses:  None   Discharge Instructions   None    ED Prescriptions   None    PDMP not reviewed this encounter.   Charma Igo, [place] 02/13/23 [phone]

## 2023-02-22 ENCOUNTER — Telehealth: Payer: BC Managed Care – PPO | Admitting: Nurse Practitioner

## 2023-02-22 DIAGNOSIS — J020 Streptococcal pharyngitis: Secondary | ICD-10-CM | POA: Diagnosis not present

## 2023-02-22 MED ORDER — AZITHROMYCIN 250 MG PO TABS
ORAL_TABLET | ORAL | 0 refills | Status: AC
Start: 2023-02-22 — End: 2023-02-27

## 2023-02-22 NOTE — Progress Notes (Signed)
Virtual Visit Consent   Laura Carey, you are scheduled for a virtual visit with a [facility] provider today. Just as with appointments in the office, your consent must be obtained to participate. Your consent will be active for this visit and any virtual visit you may have with one of our providers in the next 365 days. If you have a MyChart account, a copy of this consent can be sent to you electronically.  As this is a virtual visit, video technology does not allow for your provider to perform a traditional examination. This may limit your provider's ability to fully assess your condition. If your provider identifies any concerns that need to be evaluated in person or the need to arrange testing (such as labs, EKG, etc.), we will make arrangements to do so. Although advances in technology are sophisticated, we cannot ensure that it will always work on either your end or our end. If the connection with a video visit is poor, the visit may have to be switched to a telephone visit. With either a video or telephone visit, we are not always able to ensure that we have a secure connection.  By engaging in this virtual visit, you consent to the provision of healthcare and authorize for your insurance to be billed (if applicable) for the services provided during this visit. Depending on your insurance coverage, you may receive a charge related to this service.  I need to obtain your verbal consent now. Are you willing to proceed with your visit today? Laura Carey has provided verbal consent on 02/22/2023 for a virtual visit (video or telephone). Laura Simas, Laura Carey  Date: 02/22/2023 8:15 AM  Virtual Visit via Video Note   I, Laura Carey, connected with  Laura Carey  ([id number], 18-Sep-1985) on 02/22/23 at  8:15 AM EDT by a video-enabled telemedicine application and verified that I am speaking with the correct person using two identifiers.  Location: Patient: Virtual Visit Location Patient:  Home Provider: Virtual Visit Location Provider: Home Office   I discussed the limitations of evaluation and management by telemedicine and the availability of in person appointments. The patient expressed understanding and agreed to proceed.    History of Present Illness: Laura Carey is a 37 y.o. who identifies as a female who was assigned female at birth, and is being seen today for   She was at the [facility] 02/13/23 and given amoxicillin for 10 days. She had symptom improvement after 48 hours and then stopped the antibiotic  She now has recurrence of symptoms  Her symptoms today are sore throat, no fever, weakness   She took one Amoxicillin this morning   Problems:  Patient Active Problem List   Diagnosis Date Noted   Recurrent aphthous stomatitis 11/15/2022   Annual physical exam 08/26/2021   Avitaminosis D 08/26/2021    Allergies: No Known Allergies  Medications:   Observations/Objective: Patient is well-developed, well-nourished in no acute distress.  Resting comfortably  at home.  Head is normocephalic, atraumatic.  No labored breathing.  Speech is clear and coherent with logical content.  Patient is alert and oriented at baseline.    Assessment and Plan: 1. Strep throat  - azithromycin (ZITHROMAX) 250 MG tablet; Take 2 tablets on day 1, then 1 tablet daily on days 2 through 5  Dispense: 6 tablet; Refill: 0    Will change to azithromycin for compliance  Stop amoxicillin   Follow up if symptoms persist or with new concerns   Follow Up Instructions:  I discussed the assessment and treatment plan with the patient. The patient was provided an opportunity to ask questions and all were answered. The patient agreed with the plan and demonstrated an understanding of the instructions.  A copy of instructions were sent to the patient via MyChart unless otherwise noted below.   The patient was advised to call back or seek an in-person evaluation if the symptoms worsen or if the  condition fails to improve as anticipated.  Time:  I spent 15 minutes with the patient via telehealth technology discussing the above problems/concerns.    Laura Simas, Laura Carey

## 2023-03-17 ENCOUNTER — Encounter: Payer: Self-pay | Admitting: Family Medicine

## 2023-10-06 ENCOUNTER — Ambulatory Visit (INDEPENDENT_AMBULATORY_CARE_PROVIDER_SITE_OTHER): Payer: BC Managed Care – PPO | Admitting: Family Medicine

## 2023-10-06 ENCOUNTER — Encounter: Payer: Self-pay | Admitting: Family Medicine

## 2023-10-06 VITALS — BP 111/78 | HR 61 | Resp 16 | Ht 66.0 in | Wt 142.7 lb

## 2023-10-06 DIAGNOSIS — Z130 Encounter for screening for diseases of the blood and blood-forming organs and certain disorders involving the immune mechanism: Secondary | ICD-10-CM | POA: Diagnosis not present

## 2023-10-06 DIAGNOSIS — E559 Vitamin D deficiency, unspecified: Secondary | ICD-10-CM | POA: Diagnosis not present

## 2023-10-06 DIAGNOSIS — Z13220 Encounter for screening for lipoid disorders: Secondary | ICD-10-CM

## 2023-10-06 DIAGNOSIS — Z1589 Genetic susceptibility to other disease: Secondary | ICD-10-CM | POA: Insufficient documentation

## 2023-10-06 DIAGNOSIS — F439 Reaction to severe stress, unspecified: Secondary | ICD-10-CM

## 2023-10-06 DIAGNOSIS — N926 Irregular menstruation, unspecified: Secondary | ICD-10-CM | POA: Insufficient documentation

## 2023-10-06 DIAGNOSIS — Z13228 Encounter for screening for other metabolic disorders: Secondary | ICD-10-CM | POA: Diagnosis not present

## 2023-10-06 DIAGNOSIS — Z0001 Encounter for general adult medical examination with abnormal findings: Secondary | ICD-10-CM | POA: Diagnosis not present

## 2023-10-06 DIAGNOSIS — Z1329 Encounter for screening for other suspected endocrine disorder: Secondary | ICD-10-CM | POA: Diagnosis not present

## 2023-10-06 DIAGNOSIS — Z0000 Encounter for general adult medical examination without abnormal findings: Secondary | ICD-10-CM

## 2023-10-06 NOTE — Assessment & Plan Note (Signed)
 Noted. Potential genetic predisposition to ovarian, renal and urinary cancer  - Given family history, recommend routine breast cancer screening starting at age 38 and colonoscopy starting at age 32

## 2023-10-06 NOTE — Assessment & Plan Note (Signed)
 Previously diagnosed with vitamin D deficiency, normalized last year. Currently not taking any supplements. - Order vitamin D level
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
## 2023-10-06 NOTE — Assessment & Plan Note (Signed)
 Experiencing significant stress, partly due to her mother living with her temporarily. Aims to improve stress levels by year-end. - Encourage stress management techniques and self-care - Consider follow-up if stress levels do not improve

## 2023-10-06 NOTE — Assessment & Plan Note (Signed)
 Reports abnormal vaginal bleeding. Recent ultrasound and tests with Dr. Valentino Saxon showed no abnormalities. Advised to monitor for significant changes, such as a mass the size of a golf ball. - Monitor for significant changes in bleeding pattern or mass size - Follow up with Dr. Valentino Saxon as needed

## 2023-10-06 NOTE — Assessment & Plan Note (Signed)
 Physical exam overall unremarkable except as noted above.  Generally healthy, balanced diet, regular exercise via virtual reality games. Occasional headaches managed with Tylenol or ibuprofen. Family history of breast cancer. Declined COVID booster. - Order metabolic panel - Order cholesterol panel - Recommend routine breast cancer screening starting at age 38 - Recommend colonoscopy starting at age 65

## 2023-10-07 LAB — COMPREHENSIVE METABOLIC PANEL
ALT: 13 IU/L (ref 0–32)
AST: 17 IU/L (ref 0–40)
Albumin: 4.7 g/dL (ref 3.9–4.9)
Alkaline Phosphatase: 55 IU/L (ref 44–121)
BUN/Creatinine Ratio: 19 (ref 9–23)
BUN: 16 mg/dL (ref 6–20)
Bilirubin Total: 1.7 mg/dL — ABNORMAL HIGH (ref 0.0–1.2)
CO2: 22 mmol/L (ref 20–29)
Calcium: 9.7 mg/dL (ref 8.7–10.2)
Chloride: 103 mmol/L (ref 96–106)
Creatinine, Ser: 0.83 mg/dL (ref 0.57–1.00)
Globulin, Total: 2.7 g/dL (ref 1.5–4.5)
Glucose: 92 mg/dL (ref 70–99)
Potassium: 4.5 mmol/L (ref 3.5–5.2)
Sodium: 140 mmol/L (ref 134–144)
Total Protein: 7.4 g/dL (ref 6.0–8.5)
eGFR: 93 mL/min/{1.73_m2} (ref >59)

## 2023-10-07 LAB — VITAMIN D 25 HYDROXY (VIT D DEFICIENCY, FRACTURES): Vit D, 25-Hydroxy: 34.2 ng/mL (ref 30.0–100.0)

## 2023-10-07 LAB — LIPID PANEL
Chol/HDL Ratio: 2.5 ratio (ref 0.0–4.4)
Cholesterol, Total: 136 mg/dL (ref 100–199)
HDL: 55 mg/dL (ref >39)
LDL Chol Calc (NIH): 70 mg/dL (ref 0–99)
Triglycerides: 47 mg/dL (ref 0–149)
VLDL Cholesterol Cal: 11 mg/dL (ref 5–40)

## 2023-10-20 ENCOUNTER — Encounter: Payer: Self-pay | Admitting: Family Medicine

## 2024-10-11 ENCOUNTER — Encounter: Payer: BC Managed Care – PPO | Admitting: Family Medicine
